# Patient Record
Sex: Female | Born: 1965 | Race: Black or African American | Hispanic: No | Marital: Single | Smoking: Never smoker
Health system: Southern US, Community
[De-identification: ages and names within clinical notes are randomized; demographics above are authoritative.]

## PROBLEM LIST (undated history)

## (undated) DIAGNOSIS — J189 Pneumonia, unspecified organism: Secondary | ICD-10-CM

## (undated) DIAGNOSIS — J45909 Unspecified asthma, uncomplicated: Secondary | ICD-10-CM

## (undated) DIAGNOSIS — D649 Anemia, unspecified: Secondary | ICD-10-CM

## (undated) DIAGNOSIS — R51 Headache: Secondary | ICD-10-CM

## (undated) DIAGNOSIS — K219 Gastro-esophageal reflux disease without esophagitis: Secondary | ICD-10-CM

## (undated) DIAGNOSIS — M329 Systemic lupus erythematosus, unspecified: Secondary | ICD-10-CM

## (undated) DIAGNOSIS — I7300 Raynaud's syndrome without gangrene: Secondary | ICD-10-CM

## (undated) DIAGNOSIS — K819 Cholecystitis, unspecified: Secondary | ICD-10-CM

## (undated) DIAGNOSIS — R519 Headache, unspecified: Secondary | ICD-10-CM

## (undated) DIAGNOSIS — IMO0002 Reserved for concepts with insufficient information to code with codable children: Secondary | ICD-10-CM

## (undated) HISTORY — PX: OTHER SURGICAL HISTORY: SHX169

## (undated) HISTORY — PX: GANGLION CYST EXCISION: SHX1691

---

## 2010-12-25 ENCOUNTER — Encounter (INDEPENDENT_AMBULATORY_CARE_PROVIDER_SITE_OTHER): Payer: Managed Care, Other (non HMO) | Admitting: Ophthalmology

## 2010-12-25 DIAGNOSIS — Z09 Encounter for follow-up examination after completed treatment for conditions other than malignant neoplasm: Secondary | ICD-10-CM

## 2010-12-25 DIAGNOSIS — H43819 Vitreous degeneration, unspecified eye: Secondary | ICD-10-CM

## 2012-05-05 ENCOUNTER — Other Ambulatory Visit (HOSPITAL_COMMUNITY)
Admission: RE | Admit: 2012-05-05 | Discharge: 2012-05-05 | Disposition: A | Discharge status: Home / Self Care | Payer: 59 | Source: Ambulatory Visit | Attending: Obstetrics and Gynecology | Admitting: Obstetrics and Gynecology

## 2012-05-05 ENCOUNTER — Other Ambulatory Visit: Payer: Self-pay | Admitting: Obstetrics and Gynecology

## 2012-05-05 DIAGNOSIS — Z113 Encounter for screening for infections with a predominantly sexual mode of transmission: Secondary | ICD-10-CM | POA: Insufficient documentation

## 2012-05-05 DIAGNOSIS — Z1151 Encounter for screening for human papillomavirus (HPV): Secondary | ICD-10-CM | POA: Insufficient documentation

## 2012-05-05 DIAGNOSIS — Z01419 Encounter for gynecological examination (general) (routine) without abnormal findings: Secondary | ICD-10-CM | POA: Insufficient documentation

## 2012-05-24 ENCOUNTER — Other Ambulatory Visit: Payer: Self-pay | Admitting: Obstetrics and Gynecology

## 2013-06-14 ENCOUNTER — Other Ambulatory Visit: Payer: Self-pay

## 2013-06-14 DIAGNOSIS — Z1231 Encounter for screening mammogram for malignant neoplasm of breast: Secondary | ICD-10-CM

## 2013-06-22 ENCOUNTER — Encounter (HOSPITAL_COMMUNITY): Payer: Self-pay | Admitting: Emergency Medicine

## 2013-06-22 ENCOUNTER — Emergency Department (HOSPITAL_COMMUNITY)
Admission: EM | Admit: 2013-06-22 | Discharge: 2013-06-22 | Disposition: A | Discharge status: Home / Self Care | Payer: 59 | Attending: Emergency Medicine | Admitting: Emergency Medicine

## 2013-06-22 ENCOUNTER — Emergency Department (HOSPITAL_COMMUNITY): Payer: 59

## 2013-06-22 DIAGNOSIS — Y929 Unspecified place or not applicable: Secondary | ICD-10-CM | POA: Insufficient documentation

## 2013-06-22 DIAGNOSIS — Z872 Personal history of diseases of the skin and subcutaneous tissue: Secondary | ICD-10-CM | POA: Insufficient documentation

## 2013-06-22 DIAGNOSIS — T148XXA Other injury of unspecified body region, initial encounter: Secondary | ICD-10-CM

## 2013-06-22 DIAGNOSIS — Z880 Allergy status to penicillin: Secondary | ICD-10-CM | POA: Insufficient documentation

## 2013-06-22 DIAGNOSIS — Y9389 Activity, other specified: Secondary | ICD-10-CM | POA: Insufficient documentation

## 2013-06-22 DIAGNOSIS — X58XXXA Exposure to other specified factors, initial encounter: Secondary | ICD-10-CM | POA: Insufficient documentation

## 2013-06-22 DIAGNOSIS — J45909 Unspecified asthma, uncomplicated: Secondary | ICD-10-CM | POA: Insufficient documentation

## 2013-06-22 DIAGNOSIS — IMO0002 Reserved for concepts with insufficient information to code with codable children: Secondary | ICD-10-CM | POA: Insufficient documentation

## 2013-06-22 HISTORY — DX: Unspecified asthma, uncomplicated: J45.909

## 2013-06-22 HISTORY — DX: Reserved for concepts with insufficient information to code with codable children: IMO0002

## 2013-06-22 HISTORY — DX: Systemic lupus erythematosus, unspecified: M32.9

## 2013-06-22 MED ORDER — HYDROCODONE-ACETAMINOPHEN 5-325 MG PO TABS: 1.0000 | ORAL_TABLET | ORAL | Status: DC | PRN

## 2013-06-22 MED ORDER — METAXALONE 800 MG PO TABS: 800.0000 mg | ORAL_TABLET | Freq: Three times a day (TID) | ORAL | Status: DC

## 2013-06-22 MED ORDER — SODIUM CHLORIDE 0.9 % IV SOLN: INTRAVENOUS | Status: DC

## 2013-06-22 NOTE — ED Provider Notes (Signed)
CSN: [id number]     Arrival date & time 06/22/13  [id number] History  This chart was scribed for Toy BakerAnthony T Jessy Cybulski, MD by Shari HeritageAisha Amuda, ED Scribe. The patient was seen in room APA05/APA05. Patient's care was started at 7:22 AM.  Chief Complaint  Patient presents with  . Abdominal Pain    The history is provided by the patient. No language interpreter was used.   HPI Comments: Charlett NoseVickie Zerbe is a 48 y.o. female who presents to the Emergency Department complaining of sharp, constant right sided abdominal pain starting this morning when she woke up for work and reached over to turn her off her alarm clock. She reports that this pain is new and that she did not have any pain yesterday. Her pain is exacerbated by movement, coughing, and sitting in certain positions. She denies vomiting, SOB, fever, and chills. Patient has a history of lupus. Symptoms have been persistent and no treatment used prior to arrival  Past Medical History  Diagnosis Date  . Lupus   . Asthma    Past Surgical History  Procedure Laterality Date  . Fibroid tumors    . Ganglion cyst excision Bilateral    No family history on file. History  Substance Use Topics  . Smoking status: Never Smoker   . Smokeless tobacco: Not on file  . Alcohol Use: No   OB History   Grav Para Term Preterm Abortions TAB SAB Ect Mult Living                 Review of Systems  Constitutional: Negative for fever and chills.  Gastrointestinal: Positive for abdominal pain (right side). Negative for nausea and vomiting.  Musculoskeletal: Negative for back pain.  All other systems reviewed and are negative.   Allergies  Aspirin; Codeine; Dust mite extract; Penicillins; and Sulfa antibiotics  Home Medications   Prior to Admission medications   Not on File   Triage Vitals: BP 148/101  Pulse 89  Temp(Src) 98.3 F (36.8 C) (Oral)  Resp 18  Ht 5' (1.524 m)  Wt 173 lb (78.472 kg)  BMI 33.79 kg/m2  SpO2 100%  LMP 06/15/2013 Physical Exam   Nursing note and vitals reviewed. Constitutional: She is oriented to person, place, and time. She appears well-developed and well-nourished.  Non-toxic appearance. No distress.  HENT:  Head: Normocephalic and atraumatic.  Eyes: Conjunctivae, EOM and lids are normal. Pupils are equal, round, and reactive to light.  Neck: Normal range of motion. Neck supple. No tracheal deviation present. No mass present.  Cardiovascular: Normal rate, regular rhythm and normal heart sounds.  Exam reveals no gallop.   No murmur heard. Pulmonary/Chest: Effort normal and breath sounds normal. No stridor. No respiratory distress. She has no decreased breath sounds. She has no wheezes. She has no rhonchi. She has no rales. She exhibits bony tenderness. She exhibits no crepitus.  Abdominal: Soft. Normal appearance and bowel sounds are normal. She exhibits no distension. There is no tenderness. There is no rebound and no CVA tenderness.    Musculoskeletal: Normal range of motion. She exhibits no edema and no tenderness.  Neurological: She is alert and oriented to person, place, and time. She has normal strength. No cranial nerve deficit or sensory deficit. GCS eye subscore is 4. GCS verbal subscore is 5. GCS motor subscore is 6.  Skin: Skin is warm and dry. No abrasion and no rash noted.  Psychiatric: She has a normal mood and affect. Her speech is normal and behavior  is normal.    ED Course  Procedures (including critical care time) DIAGNOSTIC STUDIES: Oxygen Saturation is 100% on room air, normal by my interpretation.    COORDINATION OF CARE: 7:27 AM- Will order x-ray of chest and right ribs. Patient informed of current plan for treatment and evaluation and agrees with plan at this time.    Labs Review Labs Reviewed - No data to display  Imaging Review No results found.   EKG Interpretation None      MDM   Final diagnoses:  None    I personally performed the services described in this  documentation, which was scribed in my presence. The recorded information has been reviewed and is accurate.  Patient with likely muscle strain. Rib series is negative. Patient to be placed on muscle accidents and given pain medication. Return precautions given   Toy BakerAnthony T Jireh Vinas, MD 06/22/13 [phone]

## 2013-06-22 NOTE — ED Notes (Signed)
Pt undressing, waist up. Hospital gown on

## 2013-06-22 NOTE — Discharge Instructions (Signed)
Return here for trouble breathing or worsening pain Muscle Strain A muscle strain is an injury that occurs when a muscle is stretched beyond its normal length. Usually a small number of muscle fibers are torn when this happens. Muscle strain is rated in degrees. First-degree strains have the least amount of muscle fiber tearing and pain. Second-degree and third-degree strains have increasingly more tearing and pain.  Usually, recovery from muscle strain takes 1-2 weeks. Complete healing takes 5-6 weeks.  CAUSES  Muscle strain happens when a sudden, violent force placed on a muscle stretches it too far. This may occur with lifting, sports, or a fall.  RISK FACTORS Muscle strain is especially common in athletes.  SIGNS AND SYMPTOMS At the site of the muscle strain, there may be:  Pain.  Bruising.  Swelling.  Difficulty using the muscle due to pain or lack of normal function. DIAGNOSIS  Your health care provider will perform a physical exam and ask about your medical history. TREATMENT  Often, the best treatment for a muscle strain is resting, icing, and applying cold compresses to the injured area.  HOME CARE INSTRUCTIONS   Use the PRICE method of treatment to promote muscle healing during the first 2-3 days after your injury. The PRICE method involves:  Protecting the muscle from being injured again.  Restricting your activity and resting the injured body part.  Icing your injury. To do this, put ice in a plastic bag. Place a towel between your skin and the bag. Then, apply the ice and leave it on from 15-20 minutes each hour. After the third day, switch to moist heat packs.  Apply compression to the injured area with a splint or elastic bandage. Be careful not to wrap it too tightly. This may interfere with blood circulation or increase swelling.  Elevate the injured body part above the level of your heart as often as you can.  Only take over-the-counter or prescription medicines  for pain, discomfort, or fever as directed by your health care provider.  Warming up prior to exercise helps to prevent future muscle strains. SEEK MEDICAL CARE IF:   You have increasing pain or swelling in the injured area.  You have numbness, tingling, or a significant loss of strength in the injured area. MAKE SURE YOU:   Understand these instructions.  Will watch your condition.  Will get help right away if you are not doing well or get worse. Document Released: 12/23/2004 Document Revised: 10/13/2012 Document Reviewed: 07/22/2012 [facility] Patient Information 2015 [employer], [place]. This information is not intended to replace advice given to you by your health care provider. Make sure you discuss any questions you have with your health care provider.

## 2013-06-22 NOTE — ED Notes (Signed)
Patient c/o right side pain that started when she woke up this morning.  Patient denies any nausea or vomiting.

## 2013-06-22 NOTE — ED Notes (Signed)
Present with Dr. Freida BusmanAllen in room performing assessment.

## 2013-06-23 ENCOUNTER — Ambulatory Visit
Admission: RE | Admit: 2013-06-23 | Discharge: 2013-06-23 | Disposition: A | Discharge status: Home / Self Care | Payer: 59 | Source: Ambulatory Visit

## 2013-06-23 DIAGNOSIS — Z1231 Encounter for screening mammogram for malignant neoplasm of breast: Secondary | ICD-10-CM

## 2014-05-10 ENCOUNTER — Other Ambulatory Visit: Payer: Self-pay | Admitting: Obstetrics and Gynecology

## 2014-05-10 ENCOUNTER — Other Ambulatory Visit (HOSPITAL_COMMUNITY)
Admission: RE | Admit: 2014-05-10 | Discharge: 2014-05-10 | Disposition: A | Discharge status: Home / Self Care | Payer: 59 | Source: Ambulatory Visit | Attending: Obstetrics and Gynecology | Admitting: Obstetrics and Gynecology

## 2014-05-10 DIAGNOSIS — Z01419 Encounter for gynecological examination (general) (routine) without abnormal findings: Secondary | ICD-10-CM | POA: Insufficient documentation

## 2014-05-11 LAB — CYTOLOGY - PAP

## 2014-05-31 ENCOUNTER — Other Ambulatory Visit: Payer: Self-pay | Admitting: Obstetrics and Gynecology

## 2014-07-17 ENCOUNTER — Other Ambulatory Visit: Payer: Self-pay

## 2014-07-17 DIAGNOSIS — Z1231 Encounter for screening mammogram for malignant neoplasm of breast: Secondary | ICD-10-CM

## 2014-08-17 ENCOUNTER — Ambulatory Visit: Payer: 59

## 2014-08-31 ENCOUNTER — Ambulatory Visit
Admission: RE | Admit: 2014-08-31 | Discharge: 2014-08-31 | Disposition: A | Discharge status: Home / Self Care | Payer: 59 | Source: Ambulatory Visit

## 2014-08-31 ENCOUNTER — Inpatient Hospital Stay: Admission: RE | Admit: 2014-08-31 | Payer: 59 | Source: Ambulatory Visit

## 2014-08-31 DIAGNOSIS — Z1231 Encounter for screening mammogram for malignant neoplasm of breast: Secondary | ICD-10-CM

## 2015-05-18 ENCOUNTER — Other Ambulatory Visit: Payer: Self-pay | Admitting: Obstetrics and Gynecology

## 2015-05-18 ENCOUNTER — Other Ambulatory Visit (HOSPITAL_COMMUNITY)
Admission: RE | Admit: 2015-05-18 | Discharge: 2015-05-18 | Disposition: A | Discharge status: Home / Self Care | Payer: 59 | Source: Ambulatory Visit | Attending: Obstetrics and Gynecology | Admitting: Obstetrics and Gynecology

## 2015-05-18 DIAGNOSIS — Z01419 Encounter for gynecological examination (general) (routine) without abnormal findings: Secondary | ICD-10-CM | POA: Diagnosis present

## 2015-05-18 DIAGNOSIS — Z1151 Encounter for screening for human papillomavirus (HPV): Secondary | ICD-10-CM | POA: Diagnosis not present

## 2015-05-22 LAB — CYTOLOGY - PAP

## 2015-08-31 ENCOUNTER — Other Ambulatory Visit: Payer: Self-pay | Admitting: Obstetrics and Gynecology

## 2015-08-31 ENCOUNTER — Other Ambulatory Visit: Payer: Self-pay | Admitting: Emergency Medicine

## 2015-08-31 DIAGNOSIS — Z1231 Encounter for screening mammogram for malignant neoplasm of breast: Secondary | ICD-10-CM

## 2015-09-07 ENCOUNTER — Ambulatory Visit
Admission: RE | Admit: 2015-09-07 | Discharge: 2015-09-07 | Disposition: A | Discharge status: Home / Self Care | Payer: 59 | Source: Ambulatory Visit | Attending: Physician Assistant | Admitting: Physician Assistant

## 2015-09-07 ENCOUNTER — Other Ambulatory Visit: Payer: Self-pay | Admitting: Physician Assistant

## 2015-09-07 DIAGNOSIS — R059 Cough, unspecified: Secondary | ICD-10-CM

## 2015-09-07 DIAGNOSIS — R05 Cough: Secondary | ICD-10-CM

## 2015-09-12 ENCOUNTER — Ambulatory Visit
Admission: RE | Admit: 2015-09-12 | Discharge: 2015-09-12 | Disposition: A | Discharge status: Home / Self Care | Payer: 59 | Source: Ambulatory Visit | Attending: Obstetrics and Gynecology | Admitting: Obstetrics and Gynecology

## 2015-09-12 DIAGNOSIS — Z1231 Encounter for screening mammogram for malignant neoplasm of breast: Secondary | ICD-10-CM

## 2016-05-23 ENCOUNTER — Other Ambulatory Visit: Payer: Self-pay | Admitting: Obstetrics and Gynecology

## 2016-05-23 DIAGNOSIS — Z1231 Encounter for screening mammogram for malignant neoplasm of breast: Secondary | ICD-10-CM

## 2016-08-06 DIAGNOSIS — K819 Cholecystitis, unspecified: Secondary | ICD-10-CM

## 2016-08-06 HISTORY — DX: Cholecystitis, unspecified: K81.9

## 2016-08-12 ENCOUNTER — Ambulatory Visit: Payer: Self-pay | Admitting: Surgery

## 2016-08-12 ENCOUNTER — Other Ambulatory Visit: Payer: Self-pay | Admitting: Family Medicine

## 2016-08-12 ENCOUNTER — Encounter (HOSPITAL_COMMUNITY): Payer: Self-pay

## 2016-08-12 ENCOUNTER — Ambulatory Visit
Admission: RE | Admit: 2016-08-12 | Discharge: 2016-08-12 | Disposition: A | Discharge status: Home / Self Care | Payer: 59 | Source: Ambulatory Visit | Attending: Family Medicine | Admitting: Family Medicine

## 2016-08-12 ENCOUNTER — Observation Stay (HOSPITAL_COMMUNITY)
Admission: EM | Admit: 2016-08-12 | Discharge: 2016-08-14 | DRG: 419 | Disposition: A | Discharge status: Home / Self Care | Payer: 59 | Attending: Internal Medicine | Admitting: Internal Medicine

## 2016-08-12 DIAGNOSIS — R52 Pain, unspecified: Secondary | ICD-10-CM

## 2016-08-12 DIAGNOSIS — M329 Systemic lupus erythematosus, unspecified: Secondary | ICD-10-CM | POA: Insufficient documentation

## 2016-08-12 DIAGNOSIS — Z79899 Other long term (current) drug therapy: Secondary | ICD-10-CM | POA: Diagnosis not present

## 2016-08-12 DIAGNOSIS — J4520 Mild intermittent asthma, uncomplicated: Secondary | ICD-10-CM | POA: Diagnosis not present

## 2016-08-12 DIAGNOSIS — Z833 Family history of diabetes mellitus: Secondary | ICD-10-CM | POA: Insufficient documentation

## 2016-08-12 DIAGNOSIS — Z6835 Body mass index (BMI) 35.0-35.9, adult: Secondary | ICD-10-CM | POA: Insufficient documentation

## 2016-08-12 DIAGNOSIS — K819 Cholecystitis, unspecified: Secondary | ICD-10-CM

## 2016-08-12 DIAGNOSIS — Z8249 Family history of ischemic heart disease and other diseases of the circulatory system: Secondary | ICD-10-CM | POA: Diagnosis not present

## 2016-08-12 DIAGNOSIS — K8000 Calculus of gallbladder with acute cholecystitis without obstruction: Principal | ICD-10-CM | POA: Insufficient documentation

## 2016-08-12 DIAGNOSIS — R7989 Other specified abnormal findings of blood chemistry: Secondary | ICD-10-CM | POA: Diagnosis present

## 2016-08-12 DIAGNOSIS — J45909 Unspecified asthma, uncomplicated: Secondary | ICD-10-CM | POA: Insufficient documentation

## 2016-08-12 DIAGNOSIS — K810 Acute cholecystitis: Secondary | ICD-10-CM | POA: Diagnosis not present

## 2016-08-12 DIAGNOSIS — K219 Gastro-esophageal reflux disease without esophagitis: Secondary | ICD-10-CM | POA: Insufficient documentation

## 2016-08-12 DIAGNOSIS — R1013 Epigastric pain: Secondary | ICD-10-CM | POA: Diagnosis present

## 2016-08-12 DIAGNOSIS — Z419 Encounter for procedure for purposes other than remedying health state, unspecified: Secondary | ICD-10-CM

## 2016-08-12 DIAGNOSIS — R1011 Right upper quadrant pain: Secondary | ICD-10-CM

## 2016-08-12 DIAGNOSIS — R945 Abnormal results of liver function studies: Secondary | ICD-10-CM

## 2016-08-12 HISTORY — DX: Cholecystitis, unspecified: K81.9

## 2016-08-12 LAB — LIPASE, BLOOD: LIPASE: 141 U/L — AB (ref 11–51)

## 2016-08-12 LAB — COMPREHENSIVE METABOLIC PANEL
ALT: 430 U/L — ABNORMAL HIGH (ref 14–54)
ANION GAP: 13 (ref 5–15)
AST: 376 U/L — ABNORMAL HIGH (ref 15–41)
Albumin: 4.4 g/dL (ref 3.5–5.0)
Alkaline Phosphatase: 224 U/L — ABNORMAL HIGH (ref 38–126)
BUN: 8 mg/dL (ref 6–20)
CHLORIDE: 104 mmol/L (ref 101–111)
CO2: 20 mmol/L — AB (ref 22–32)
Calcium: 9.2 mg/dL (ref 8.9–10.3)
Creatinine, Ser: 0.68 mg/dL (ref 0.44–1.00)
GFR calc non Af Amer: >60 mL/min (ref >60)
Glucose, Bld: 85 mg/dL (ref 65–99)
Potassium: 3.7 mmol/L (ref 3.5–5.1)
Sodium: 137 mmol/L (ref 135–145)
Total Bilirubin: 1.3 mg/dL — ABNORMAL HIGH (ref 0.3–1.2)
Total Protein: 8.1 g/dL (ref 6.5–8.1)

## 2016-08-12 LAB — CBC
HCT: 40.3 % (ref 36.0–46.0)
HEMOGLOBIN: 12.7 g/dL (ref 12.0–15.0)
MCH: 26.8 pg (ref 26.0–34.0)
MCHC: 31.5 g/dL (ref 30.0–36.0)
MCV: 85.2 fL (ref 78.0–100.0)
Platelets: 306 10*3/uL (ref 150–400)
RBC: 4.73 MIL/uL (ref 3.87–5.11)
RDW: 12.9 % (ref 11.5–15.5)
WBC: 6.9 10*3/uL (ref 4.0–10.5)

## 2016-08-12 LAB — I-STAT CG4 LACTIC ACID, ED: LACTIC ACID, VENOUS: 1.2 mmol/L (ref 0.5–1.9)

## 2016-08-12 MED ORDER — OXYCODONE HCL 5 MG PO TABS
5.0000 mg | ORAL_TABLET | ORAL | Status: DC | PRN
  Filled 2016-08-12: qty 1

## 2016-08-12 MED ORDER — SODIUM CHLORIDE 0.9 % IV SOLN
INTRAVENOUS | Status: DC
  Administered 2016-08-12: 23:00:00 via INTRAVENOUS

## 2016-08-12 MED ORDER — CIPROFLOXACIN IN D5W 400 MG/200ML IV SOLN
400.0000 mg | Freq: Two times a day (BID) | INTRAVENOUS | Status: DC
  Administered 2016-08-12 – 2016-08-14 (×5): 400 mg via INTRAVENOUS
  Filled 2016-08-12 (×4): qty 200

## 2016-08-12 MED ORDER — ACETAMINOPHEN 650 MG RE SUPP: 650.0000 mg | Freq: Four times a day (QID) | RECTAL | Status: DC | PRN

## 2016-08-12 MED ORDER — HYDROMORPHONE HCL 1 MG/ML IJ SOLN
0.5000 mg | Freq: Once | INTRAMUSCULAR | Status: AC
  Administered 2016-08-12: 0.5 mg via INTRAVENOUS
  Filled 2016-08-12: qty 1

## 2016-08-12 MED ORDER — SODIUM CHLORIDE 0.9 % IV BOLUS (SEPSIS)
1000.0000 mL | Freq: Once | INTRAVENOUS | Status: AC
  Administered 2016-08-12: 1000 mL via INTRAVENOUS

## 2016-08-12 MED ORDER — DOCUSATE SODIUM 100 MG PO CAPS
100.0000 mg | ORAL_CAPSULE | Freq: Two times a day (BID) | ORAL | Status: DC
  Administered 2016-08-13 – 2016-08-14 (×3): 100 mg via ORAL
  Filled 2016-08-12 (×2): qty 1

## 2016-08-12 MED ORDER — ALBUTEROL SULFATE (2.5 MG/3ML) 0.083% IN NEBU: 2.5000 mg | INHALATION_SOLUTION | RESPIRATORY_TRACT | Status: DC | PRN

## 2016-08-12 MED ORDER — SODIUM CHLORIDE 0.9 % IV SOLN
INTRAVENOUS | Status: AC
  Administered 2016-08-13: 04:00:00 via INTRAVENOUS

## 2016-08-12 MED ORDER — IBUPROFEN 600 MG PO TABS
600.0000 mg | ORAL_TABLET | Freq: Four times a day (QID) | ORAL | Status: DC | PRN
  Administered 2016-08-14: 600 mg via ORAL
  Filled 2016-08-12: qty 1

## 2016-08-12 MED ORDER — ONDANSETRON HCL 4 MG/2ML IJ SOLN
4.0000 mg | Freq: Four times a day (QID) | INTRAMUSCULAR | Status: DC | PRN
  Administered 2016-08-13 (×2): 4 mg via INTRAVENOUS
  Filled 2016-08-12 (×2): qty 2

## 2016-08-12 MED ORDER — HYDROMORPHONE HCL 1 MG/ML IJ SOLN
0.5000 mg | INTRAMUSCULAR | Status: DC | PRN
  Administered 2016-08-12: 0.5 mg via INTRAVENOUS
  Filled 2016-08-12: qty 1

## 2016-08-12 MED ORDER — IBUPROFEN 400 MG PO TABS: 600.0000 mg | ORAL_TABLET | Freq: Four times a day (QID) | ORAL | Status: DC | PRN

## 2016-08-12 MED ORDER — ACETAMINOPHEN 325 MG PO TABS
650.0000 mg | ORAL_TABLET | Freq: Four times a day (QID) | ORAL | Status: DC | PRN
  Administered 2016-08-13: 650 mg via ORAL
  Filled 2016-08-12: qty 2

## 2016-08-12 MED ORDER — ONDANSETRON HCL 4 MG PO TABS: 4.0000 mg | ORAL_TABLET | Freq: Four times a day (QID) | ORAL | Status: DC | PRN

## 2016-08-12 MED ORDER — METRONIDAZOLE IN NACL 5-0.79 MG/ML-% IV SOLN
500.0000 mg | Freq: Three times a day (TID) | INTRAVENOUS | Status: DC
  Administered 2016-08-12 – 2016-08-13 (×3): 500 mg via INTRAVENOUS
  Filled 2016-08-12 (×4): qty 100

## 2016-08-12 NOTE — Consult Note (Signed)
Surgical Consultation Requesting provider: Dr. Corlis LeakMackuen   CC: acute cholecystitis  HPI: This is a very nice 51yo woman who has been having right upper quadrant pain for about 2 weeks. It is sharp and aching in quality and radiates from the right subcostal margin to the epigastrium and right side/back. It has gradually been worsening and has always been exacerbated by eating fried foods. It became unbearable last night to the point that she could not sleep. She has not had any fever or nausea, but did spit up some gingerale at one point. She has not noticed any jaundice or change in her bowel function.  She was evaluated by her PCP today who diagnosed her with cholecystitis and directed her to the ER for further care.  She has had uterine fibroids removed via pfannenstiel but no other abdominal surgery She does not use tobacco, drugs or alcohol She works in Therapist, nutritionaloffice management at [employer]  She has asthma but does not use her daily advair and rarely needs any treatment for this. Her lupus is controlled with plaquenil, she is not on any steroids.   Allergies  Allergen Reactions  . Aspirin   . Betadine [Povidone Iodine]   . Codeine   . Dust Mite Extract   . Penicillins   . Sulfa Antibiotics     Past Medical History:  Diagnosis Date  . Asthma   . Lupus     Past Surgical History:  Procedure Laterality Date  . fibroid tumors    . GANGLION CYST EXCISION Bilateral     History reviewed. No pertinent family history.  Social History   Social History  . Marital status: Single    Spouse name: N/A  . Number of children: N/A  . Years of education: N/A   Social History Main Topics  . Smoking status: Never Smoker  . Smokeless tobacco: Never Used  . Alcohol use No  . Drug use: No  . Sexual activity: Not Asked   Other Topics Concern  . None   Social History Narrative  . None    No current facility-administered medications on file prior to encounter.    Current  Outpatient Prescriptions on File Prior to Encounter  Medication Sig Dispense Refill  . HYDROcodone-acetaminophen (NORCO/VICODIN) 5-325 MG per tablet Take 1-2 tablets by mouth every 4 (four) hours as needed for moderate pain or severe pain. 10 tablet 0  . metaxalone (SKELAXIN) 800 MG tablet Take 1 tablet (800 mg total) by mouth 3 (three) times daily. 21 tablet 0    Review of Systems: a complete, 10pt review of systems was completed with pertinent positives and negatives as documented in the HPI.   Physical Exam: Vitals:   08/12/16 1548  BP: (!) 133/96  Pulse: 94  Resp: 18  Temp: 98.3 F (36.8 C)   Gen: A&Ox3, no distress Head: normocephalic, atraumatic, extraocular motion intact, anicteric.  Neck: supple without mass or thyromegaly Chest: unlabored respirations, symmetrical air entry, clear bilaterally  Cardiovascular: RRR with palpable distal pulses Abdomen: soft, obese, tender in right subcostal margin and epigastrium, no guarding or rebound Extremities: warm, without edema, no deformities Neuro: grossly intact, strength 5/5 all 4 extremities, sensation intact to light touch x 4 extremities Psych: appropriate mood and affect, normal insight Skin: warm and dry  CBC Latest Ref Rng & Units 08/12/2016  WBC 4.0 - 10.5 K/uL 6.9  Hemoglobin 12.0 - 15.0 g/dL 29.512.7  Hematocrit 62.136.0 - 46.0 % 40.3  Platelets 150 - 400 K/uL  306    CMP Latest Ref Rng & Units 08/12/2016  Glucose 65 - 99 mg/dL 85  BUN 6 - 20 mg/dL 8  Creatinine 5.40 - 9.81 mg/dL 1.91  Sodium 478 - 295 mmol/L 137  Potassium 3.5 - 5.1 mmol/L 3.7  Chloride 101 - 111 mmol/L 104  CO2 22 - 32 mmol/L 20(L)  Calcium 8.9 - 10.3 mg/dL 9.2  Total Protein 6.5 - 8.1 g/dL 8.1  Total Bilirubin 0.3 - 1.2 mg/dL 6.2(Z)  Alkaline Phos 38 - 126 U/L 224(H)  AST 15 - 41 U/L 376(H)  ALT 14 - 54 U/L 430(H)  Lipase 141  No results found for: INR, PROTIME  Imaging: CLINICAL DATA:  Upper abdominal pain  EXAM: ABDOMEN ULTRASOUND  COMPLETE  COMPARISON:  None.  FINDINGS: Gallbladder: Within the gallbladder, there are multiple echogenic foci which move and shadow consistent with cholelithiasis. Largest individual gallstone measures 5 mm in length. There is diffuse gallbladder wall thickening with edema and pericholecystic fluid. Patient is focally tender over the gallbladder.  Common bile duct: Diameter: 5 mm. There is no intrahepatic, common hepatic, or common bile duct dilatation.  Liver: No focal lesion identified. Within normal limits in parenchymal echogenicity. Flow in the portal vein is in the anatomic direction.  IVC: No abnormality visualized in portions which can be interrogated. Most of the inferior vena cava is obscured by gas.  Pancreas: Visualized portion unremarkable. Much of the pancreas is obscured by gas.  Spleen: Size and appearance within normal limits.  Right Kidney: Length: 11.0 cm. Echogenicity within normal limits. No mass or hydronephrosis visualized.  Left Kidney: Length: 11.4 cm. Echogenicity within normal limits. No mass or hydronephrosis visualized.  Abdominal aorta: No aneurysm visualized. Portions of aorta obscured by gas.  Other findings: No demonstrable ascites.  IMPRESSION: 1. Gallstones with edematous, thickened gallbladder wall and pericholecystic fluid. Focal tenderness over the gallbladder. These findings are indicative of acute cholecystitis.  2. Portions of the pancreas, aorta, inferior vena cava are obscured by gas. Visualized portions of these structures appear unremarkable.  3.  Study otherwise unremarkable.   Electronically Signed   By: Bretta Bang III M.D.   On: 08/12/2016 14:53  A/P: 51yo woman with acute cholecystitis, elevated LFTs and mildly elevated lipase likely secondary to the same.  -Admit for fluid resuscitation, IV antibiotics, pain control -Repeat labs in AM, if downtrending anticipate laparoscopic cholecystectomy  and possible cholangiogram tomorrow with Dr. Magnus Ivan. I discussed the nature of the surgery with her, and we discussed the risks of bleeding, infection, scarring, pain, injury to intraabdominal structures specifically the common bile duct and sequelae, conversion to open surgery, blood clots, heart attack, pneumonia, sepsis and prolonged hospitalization/recovery. She and her sisters asked several insightful questions all of which were answered. -if labs uptrending she may need MRCP/ERCP and I discussed this briefly with her as well.    Phylliss Blakes, MD [facility], [place] Pager [phone]

## 2016-08-12 NOTE — H&P (Signed)
Sheri NoseVickie Monette ZHY:[id number] DOB: 10/27/1965 DOA: 08/12/2016     PCP: Assunta CurtisNoel [facility]man [facility]: Rheumatology Orlin HildingAngela Hawks Patient coming from:   home Lives alone,        Chief Complaint: Abdominal pain  HPI: Sheri Reed is a 51 y.o. female with medical history significant of asthma, SLE     Presented with intermittent abdominal pain: Few months now worsened and right upper quadrant and epigastric area usually after eating. Especially if she eats anything fatty. She had severe episode yesterday and she couldn't sleep all night presented to her primary care provider who did an ultrasound showed gallstones and thickened gallbladder is felt that she most excess cholecystitis and sent her to emergency department she hadn't had any fevers or chills currently pain and better control. No nausea vomiting fevers or chills Denies alcohol abuse   Regarding pertinent Chronic problems: Straight of asthma mild history of lupus not on steroids IN ER:  Temp (24hrs), Avg:98.3 F (36.8 C), Min:98.3 F (36.8 C), Max:98.3 F (36.8 C)      on arrival  ED Triage Vitals  Enc Vitals Group     BP 08/12/16 1548 (!) 133/96     Pulse Rate 08/12/16 1548 94     Resp 08/12/16 1548 18     Temp 08/12/16 1548 98.3 F (36.8 C)     Temp Source 08/12/16 1548 Oral     SpO2 08/12/16 1548 100 %     Weight 08/12/16 1549 180 lb (81.6 kg)     Height 08/12/16 1549 5' (1.524 m)     Head Circumference --      Peak Flow --      Pain Score 08/12/16 1548 10     Pain Loc --      Pain Edu? --      Excl. in GC? --   Lactic acid 1.2 Lipase 141 Sodium 1:30 7K3.7 albumin 4.4 AST 376 ALT 4:30 alkaline phosphatase 224 total bili 1.3 WBC 6.9 hemoglobin 12.7 platelets 306   ultrasound from outpatient facility worrisome for cholecystitis  Following Medications were ordered in ER: Medications  sodium chloride 0.9 % bolus 1,000 mL (not administered)  HYDROmorphone (DILAUDID) injection 0.5 mg (not administered)      ER provider discussed case with:  General surgery Dr. Edwina BarthonnerCondition admission to medicine for IV fluids and antibiotics and pain control repeat labs and if improving likely undergo laparoscopic cholecystectomy and cholangiogram if labs and not improving or getting worse A need MRCP ERCP and GI consult   Hospitalist was called for admission for Cholecystitis  Review of Systems:    Pertinent positives include:  abdominal pain, nausea,   Constitutional:  No weight loss, night sweats, Fevers, chills, fatigue, weight loss  HEENT:  No headaches, Difficulty swallowing,Tooth/dental problems,Sore throat,  No sneezing, itching, ear ache, nasal congestion, post nasal drip,  Cardio-vascular:  No chest pain, Orthopnea, PND, anasarca, dizziness, palpitations.no Bilateral lower extremity swelling  GI:  No heartburn, indigestion, diarrhea, change in bowel habits, loss of appetite, melena, blood in stool, hematemesis Resp:  no shortness of breath at rest. No dyspnea on exertion, No excess mucus, no productive cough, No non-productive cough, No coughing up of blood.No change in color of mucus.No wheezing. Skin:  no rash or lesions. No jaundice GU:  no dysuria, change in color of urine, no urgency or frequency. No straining to urinate.  No flank pain.  Musculoskeletal:  No joint pain or no joint swelling. No decreased range of  motion. No back pain.  Psych:  No change in mood or affect. No depression or anxiety. No memory loss.  Neuro: no localizing neurological complaints, no tingling, no weakness, no double vision, no gait abnormality, no slurred speech, no confusion  As per HPI otherwise 10 point review of systems negative.   Past Medical History: Past Medical History:  Diagnosis Date  . Asthma   . Lupus    Past Surgical History:  Procedure Laterality Date  . fibroid tumors    . GANGLION CYST EXCISION Bilateral      Social History:  Ambulatory   independently      reports  that she has never smoked. She has never used smokeless tobacco. She reports that she does not drink alcohol or use drugs.  Allergies:   Allergies  Allergen Reactions  . Aspirin   . Betadine [Povidone Iodine]   . Codeine   . Dust Mite Extract   . Penicillins   . Sulfa Antibiotics        Family History:   Family History  Problem Relation Age of Onset  . Hypertension Mother   . Breast cancer Mother   . Diabetes Father   . Hypertension Father   . Diabetes Sister   . Hypertension Sister   . CAD Other     Medications: Prior to Admission medications   Medication Sig Start Date End Date Taking? Authorizing Provider  HYDROcodone-acetaminophen (NORCO/VICODIN) 5-325 MG per tablet Take 1-2 tablets by mouth every 4 (four) hours as needed for moderate pain or severe pain. 06/22/13   Lorre Nick, MD  metaxalone (SKELAXIN) 800 MG tablet Take 1 tablet (800 mg total) by mouth 3 (three) times daily. 06/22/13   Lorre Nick, MD    Physical Exam: Patient Vitals for the past 24 hrs:  BP Temp Temp src Pulse Resp SpO2 Height Weight  08/12/16 1549 - - - - - - 5' (1.524 m) 81.6 kg (180 lb)  08/12/16 1548 (!) 133/96 98.3 F (36.8 C) Oral 94 18 100 % - -    1. General:  in No Acute distress 2. Psychological: Alert and  Oriented 3. Head/ENT:   Dry Mucous Membranes                          Head Non traumatic, neck supple                          Normal  Dentition 4. SKIN: decreased Skin turgor,  Skin clean Dry and intact no rash 5. Heart: Regular rate and rhythm no  Murmur, Rub or gallop 6. Lungs:  Clear to auscultation bilaterally, no wheezes or crackles   7. Abdomen: Soft,  Mildly tender, Non distended 8. Lower extremities: no clubbing, cyanosis, or edema 9. Neurologically Grossly intact, moving all 4 extremities equally  10. MSK: Normal range of motion   body mass index is 35.15 kg/m.  Labs on Admission:   Labs on Admission: I have personally reviewed following labs and  imaging studies  CBC:  Recent Labs Lab 08/12/16 1603  WBC 6.9  HGB 12.7  HCT 40.3  MCV 85.2  PLT 306   Basic Metabolic Panel:  Recent Labs Lab 08/12/16 1603  NA 137  K 3.7  CL 104  CO2 20*  GLUCOSE 85  BUN 8  CREATININE 0.68  CALCIUM 9.2   GFR: Estimated Creatinine Clearance: 76.9 mL/min (by C-G formula based on  SCr of 0.68 mg/dL). Liver Function Tests:  Recent Labs Lab 08/12/16 1603  AST 376*  ALT 430*  ALKPHOS 224*  BILITOT 1.3*  PROT 8.1  ALBUMIN 4.4    Recent Labs Lab 08/12/16 1603  LIPASE 141*   No results for input(s): AMMONIA in the last 168 hours. Coagulation Profile: No results for input(s): INR, PROTIME in the last 168 hours. Cardiac Enzymes: No results for input(s): CKTOTAL, CKMB, CKMBINDEX, TROPONINI in the last 168 hours. BNP (last 3 results) No results for input(s): PROBNP in the last 8760 hours. HbA1C: No results for input(s): HGBA1C in the last 72 hours. CBG: No results for input(s): GLUCAP in the last 168 hours. Lipid Profile: No results for input(s): CHOL, HDL, LDLCALC, TRIG, CHOLHDL, LDLDIRECT in the last 72 hours. Thyroid Function Tests: No results for input(s): TSH, T4TOTAL, FREET4, T3FREE, THYROIDAB in the last 72 hours. Anemia Panel: No results for input(s): VITAMINB12, FOLATE, FERRITIN, TIBC, IRON, RETICCTPCT in the last 72 hours. Urine analysis: No results found for: COLORURINE, APPEARANCEUR, LABSPEC, PHURINE, GLUCOSEU, HGBUR, BILIRUBINUR, KETONESUR, PROTEINUR, UROBILINOGEN, NITRITE, LEUKOCYTESUR Sepsis Labs: @LABRCNTIP (procalcitonin:4,lacticidven:4) )No results found for this or any previous visit (from the past 240 hour(s)).    UA   not ordered  No results found for: HGBA1C  Estimated Creatinine Clearance: 76.9 mL/min (by C-G formula based on SCr of 0.68 mg/dL).  BNP (last 3 results) No results for input(s): PROBNP in the last 8760 hours.   ECG REPORTNot ordered   Filed Weights   08/12/16 1549  Weight: 81.6  kg (180 lb)     Cultures: No results found for: SDES, SPECREQUEST, CULT, REPTSTATUS   Radiological Exams on Admission: US Abdomen Complete  Result Date: 08/12/2016 CLINICAL DATA:  Upper abdominal pain EXAM: ABDOMEN ULTRASOUND COMPLETE COMPARISON:  None. FINDINGS: Gallbladder: Within the gallbladder, there are multiple echogenic foci which move and shadow consistent with cholelithiasis. Largest individual gallstone measures 5 mm in length. There is diffuse gallbladder wall thickening with edema and pericholecystic fluid. Patient is focally tender over the gallbladder. Common bile duct: Diameter: 5 mm. There is no intrahepatic, common hepatic, or common bile duct dilatation. Liver: No focal lesion identified. Within normal limits in parenchymal echogenicity. Flow in the portal vein is in the anatomic direction. IVC: No abnormality visualized in portions which can be interrogated. Most of the inferior vena cava is obscured by gas. Pancreas: Visualized portion unremarkable. Much of the pancreas is obscured by gas. Spleen: Size and appearance within normal limits. Right Kidney: Length: 11.0 cm. Echogenicity within normal limits. No mass or hydronephrosis visualized. Left Kidney: Length: 11.4 cm. Echogenicity within normal limits. No mass or hydronephrosis visualized. Abdominal aorta: No aneurysm visualized. Portions of aorta obscured by gas. Other findings: No demonstrable ascites. IMPRESSION: 1. Gallstones with edematous, thickened gallbladder wall and pericholecystic fluid. Focal tenderness over the gallbladder. These findings are indicative of acute cholecystitis. 2. Portions of the pancreas, aorta, inferior vena cava are obscured by gas. Visualized portions of these structures appear unremarkable. 3.  Study otherwise unremarkable. Electronically Signed   By: Bretta Bang III M.D.   On: 08/12/2016 14:53    Chart has been reviewed    Assessment/Plan   51 y.o. female with medical history  significant of asthma, SLE   Admitted for acute cholecystitis   Present on Admission: . Acute cholecystitis -admit for IV fluids, IV antibiotics Cipro and Flagyl, pain control. Repeat cementing the morning if downtrending labs patient will undergo cholecystectomy if LFTs going up she will  need GI consult for possible ERCP . Elevated LFTs most likely secondary to cholecystitis continue to follow   asthma -  chronic currently stable albuterol when necessary  Lupus -  currently stable  hold plaquenil while NPO  Other plan as per orders.  DVT prophylaxis:  SCD     Code Status:  FULL CODE as per patient    Family Communication:   Family  at  Bedside  plan of care was discussed with   Sisters,   Disposition Plan:    To home once workup is complete and patient is stable              Consults called: general Surgery    Admission status:   inpatient       Level of care           medical floor           I have spent a total of 56 min on this admission   Kentarius Partington 08/12/2016, 9:00 PM    [facility]  Pager [phone]   after 2 AM please page floor coverage PA If 7AM-7PM, please contact the day team taking care of the patient  [email or web address]  Password [email or web address]

## 2016-08-12 NOTE — ED Triage Notes (Signed)
Pt states she was sent here by her PCP for acute cholecystitis. Pt reports abd pain for "a while" but states it got much worse last night. Pt denies n/v.

## 2016-08-12 NOTE — ED Provider Notes (Signed)
[facility]-EMERGENCY DEPT Provider Note   CSN: [id number] Arrival date & time: 08/12/16  1531     History   Chief Complaint Chief Complaint  Patient presents with  . Abdominal Pain   HPI  Blood pressure (!) 133/96, pulse 94, temperature 98.3 F (36.8 C), temperature source Oral, resp. rate 18, height 5' (1.524 m), weight 81.6 kg (180 lb), last menstrual period 08/06/2016, SpO2 100 %.  Sheri Reed is a 51 y.o. female asked medical history significant for asthma, SLE sent from primary care for cholecystitis. She's been having intermittent right upper quadrant and epigastric abdominal pain over the course of the last several months, exacerbated by eating especially fatty foods. The pain became especially severe yesterday and she was unable to sleep. She was evaluated by Deboraha Sprang primary care this morning and had an outpatient ultrasound which showed gallstones with a thickened gallbladder wall and pericholecystic fluid. She denies fever, chills. Last oral intake was last evening.  Past Medical History:  Diagnosis Date  . Asthma   . Lupus     Patient Active Problem List   Diagnosis Date Noted  . Acute cholecystitis 08/12/2016  . Elevated LFTs 08/12/2016    Past Surgical History:  Procedure Laterality Date  . fibroid tumors    . GANGLION CYST EXCISION Bilateral     OB History    No data available       Home Medications    Prior to Admission medications   Medication Sig Start Date End Date Taking? Authorizing Provider  HYDROcodone-acetaminophen (NORCO/VICODIN) 5-325 MG per tablet Take 1-2 tablets by mouth every 4 (four) hours as needed for moderate pain or severe pain. 06/22/13   Lorre Nick, MD  metaxalone (SKELAXIN) 800 MG tablet Take 1 tablet (800 mg total) by mouth 3 (three) times daily. 06/22/13   Lorre Nick, MD    Family History History reviewed. No pertinent family history.  Social History Social History  Substance Use Topics  . Smoking status: Never Smoker  .  Smokeless tobacco: Never Used  . Alcohol use No     Allergies   Aspirin; Betadine [povidone iodine]; Codeine; Dust mite extract; Penicillins; and Sulfa antibiotics   Review of Systems Review of Systems  A complete review of systems was obtained and all systems are negative except as noted in the HPI and PMH.   Physical Exam Updated Vital Signs BP (!) 133/96 (BP Location: Right Arm)   Pulse 94   Temp 98.3 F (36.8 C) (Oral)   Resp 18   Ht 5' (1.524 m)   Wt 81.6 kg (180 lb)   LMP 08/06/2016 (Within Days)   SpO2 100%   BMI 35.15 kg/m   Physical Exam  Constitutional: She is oriented to person, place, and time. She appears well-developed and well-nourished. No distress.  Nontoxic appearing  HENT:  Head: Normocephalic and atraumatic.  Mouth/Throat: Oropharynx is clear and moist.  Eyes: Pupils are equal, round, and reactive to light. Conjunctivae and EOM are normal.  Neck: Normal range of motion.  Cardiovascular: Normal rate, regular rhythm and intact distal pulses.   Pulmonary/Chest: Effort normal and breath sounds normal.  Abdominal: Soft. She exhibits no distension and no mass. There is tenderness. There is no rebound and no guarding. No hernia.  Mild tenderness to palpation in the right upper quadrant with no guarding or rebound.  Musculoskeletal: Normal range of motion.  Neurological: She is alert and oriented to person, place, and time.  Skin: She is not diaphoretic.  Psychiatric:  She has a normal mood and affect.  Nursing note and vitals reviewed.     ED Treatments / Results  Labs (all labs ordered are listed, but only abnormal results are displayed) Labs Reviewed  LIPASE, BLOOD - Abnormal; Notable for the following:       Result Value   Lipase 141 (*)    All other components within normal limits  COMPREHENSIVE METABOLIC PANEL - Abnormal; Notable for the following:    CO2 20 (*)    AST 376 (*)    ALT 430 (*)    Alkaline Phosphatase 224 (*)    Total  Bilirubin 1.3 (*)    All other components within normal limits  CBC  URINALYSIS, ROUTINE W REFLEX MICROSCOPIC  I-STAT CG4 LACTIC ACID, ED    EKG  EKG Interpretation None       Radiology [place] Abdomen Complete  Result Date: 08/12/2016 CLINICAL DATA:  Upper abdominal pain EXAM: ABDOMEN ULTRASOUND COMPLETE COMPARISON:  None. FINDINGS: Gallbladder: Within the gallbladder, there are multiple echogenic foci which move and shadow consistent with cholelithiasis. Largest individual gallstone measures 5 mm in length. There is diffuse gallbladder wall thickening with edema and pericholecystic fluid. Patient is focally tender over the gallbladder. Common bile duct: Diameter: 5 mm. There is no intrahepatic, common hepatic, or common bile duct dilatation. Liver: No focal lesion identified. Within normal limits in parenchymal echogenicity. Flow in the portal vein is in the anatomic direction. IVC: No abnormality visualized in portions which can be interrogated. Most of the inferior vena cava is obscured by gas. Pancreas: Visualized portion unremarkable. Much of the pancreas is obscured by gas. Spleen: Size and appearance within normal limits. Right Kidney: Length: 11.0 cm. Echogenicity within normal limits. No mass or hydronephrosis visualized. Left Kidney: Length: 11.4 cm. Echogenicity within normal limits. No mass or hydronephrosis visualized. Abdominal aorta: No aneurysm visualized. Portions of aorta obscured by gas. Other findings: No demonstrable ascites. IMPRESSION: 1. Gallstones with edematous, thickened gallbladder wall and pericholecystic fluid. Focal tenderness over the gallbladder. These findings are indicative of acute cholecystitis. 2. Portions of the pancreas, aorta, inferior vena cava are obscured by gas. Visualized portions of these structures appear unremarkable. 3.  Study otherwise unremarkable. Electronically Signed   By: Bretta BangWilliam  Woodruff III M.D.   On: 08/12/2016 14:53    Procedures Procedures  (including critical care time)  Medications Ordered in ED Medications  sodium chloride 0.9 % bolus 1,000 mL (not administered)  HYDROmorphone (DILAUDID) injection 0.5 mg (not administered)     Initial Impression / Assessment and Plan / ED Course  I have reviewed the triage vital signs and the nursing notes.  Pertinent labs & imaging results that were available during my care of the patient were reviewed by me and considered in my medical decision making (see chart for details).     Vitals:   08/12/16 1548 08/12/16 1549  BP: (!) 133/96   Pulse: 94   Resp: 18   Temp: 98.3 F (36.8 C)   TempSrc: Oral   SpO2: 100%   Weight:  81.6 kg (180 lb)  Height:  5' (1.524 m)    Medications  sodium chloride 0.9 % bolus 1,000 mL (not administered)  HYDROmorphone (DILAUDID) injection 0.5 mg (not administered)    Charlett NoseVickie Sanmiguel is 51 y.o. female presenting with Acute cholecystitis, afebrile and nontoxic appearing, mild tenderness to palpation over the right upper quadrant, she has a transaminitis with elevated lipase and a T bili of 1.3, outpatient ultrasound with  acute cholecystitis, advised patient to remain nothing by mouth, fluid bolus and morphine administered.  Discussed with general surgeon Dr. Doylene Canard states that she will not be taking her to the operating room tonight, request a medical admission given her comorbidities of asthma and lupus.  Plan is for cholecystectomy in the a.m. if LFTs improve overnight. Discussed with [facility] who accepts admission.     Final Clinical Impressions(s) / ED Diagnoses   Final diagnoses:  Pain  Cholecystitis    New Prescriptions New Prescriptions   No medications on file     Kaylyn Lim 08/12/16 1910    Abelino Derrick, MD 08/12/16 2342

## 2016-08-13 ENCOUNTER — Encounter (HOSPITAL_COMMUNITY): Payer: Self-pay | Admitting: Orthopedic Surgery

## 2016-08-13 ENCOUNTER — Inpatient Hospital Stay (HOSPITAL_COMMUNITY): Payer: 59 | Admitting: Certified Registered Nurse Anesthetist

## 2016-08-13 ENCOUNTER — Encounter (HOSPITAL_COMMUNITY)
Admission: EM | Disposition: A | Discharge status: Home / Self Care | Payer: Self-pay | Source: Home / Self Care | Attending: Physician Assistant

## 2016-08-13 ENCOUNTER — Inpatient Hospital Stay (HOSPITAL_COMMUNITY): Payer: 59

## 2016-08-13 DIAGNOSIS — R7989 Other specified abnormal findings of blood chemistry: Secondary | ICD-10-CM | POA: Diagnosis not present

## 2016-08-13 DIAGNOSIS — K810 Acute cholecystitis: Secondary | ICD-10-CM

## 2016-08-13 DIAGNOSIS — M329 Systemic lupus erythematosus, unspecified: Secondary | ICD-10-CM

## 2016-08-13 DIAGNOSIS — J4520 Mild intermittent asthma, uncomplicated: Secondary | ICD-10-CM | POA: Diagnosis not present

## 2016-08-13 DIAGNOSIS — J45909 Unspecified asthma, uncomplicated: Secondary | ICD-10-CM | POA: Diagnosis not present

## 2016-08-13 DIAGNOSIS — K219 Gastro-esophageal reflux disease without esophagitis: Secondary | ICD-10-CM | POA: Diagnosis not present

## 2016-08-13 HISTORY — PX: LAPAROSCOPIC CHOLECYSTECTOMY: SUR755

## 2016-08-13 HISTORY — PX: CHOLECYSTECTOMY: SHX55

## 2016-08-13 LAB — CBC
HCT: 35.6 % — ABNORMAL LOW (ref 36.0–46.0)
Hemoglobin: 11 g/dL — ABNORMAL LOW (ref 12.0–15.0)
MCH: 26.7 pg (ref 26.0–34.0)
MCHC: 30.9 g/dL (ref 30.0–36.0)
MCV: 86.4 fL (ref 78.0–100.0)
Platelets: 301 10*3/uL (ref 150–400)
RBC: 4.12 MIL/uL (ref 3.87–5.11)
RDW: 13.2 % (ref 11.5–15.5)
WBC: 5.2 10*3/uL (ref 4.0–10.5)

## 2016-08-13 LAB — URINALYSIS, ROUTINE W REFLEX MICROSCOPIC
Bacteria, UA: NONE SEEN
Bilirubin Urine: NEGATIVE
Glucose, UA: NEGATIVE mg/dL
KETONES UR: 5 mg/dL — AB
NITRITE: NEGATIVE
PROTEIN: NEGATIVE mg/dL
Specific Gravity, Urine: 1.014 (ref 1.005–1.030)
pH: 6 (ref 5.0–8.0)

## 2016-08-13 LAB — COMPREHENSIVE METABOLIC PANEL
ALBUMIN: 3.6 g/dL (ref 3.5–5.0)
ALK PHOS: 171 U/L — AB (ref 38–126)
ALT: 283 U/L — AB (ref 14–54)
ANION GAP: 8 (ref 5–15)
AST: 146 U/L — ABNORMAL HIGH (ref 15–41)
BILIRUBIN TOTAL: 0.9 mg/dL (ref 0.3–1.2)
BUN: 7 mg/dL (ref 6–20)
CALCIUM: 8.4 mg/dL — AB (ref 8.9–10.3)
CO2: 22 mmol/L (ref 22–32)
CREATININE: 0.59 mg/dL (ref 0.44–1.00)
Chloride: 109 mmol/L (ref 101–111)
GFR calc Af Amer: >60 mL/min (ref >60)
GFR calc non Af Amer: >60 mL/min (ref >60)
Glucose, Bld: 94 mg/dL (ref 65–99)
Potassium: 4.2 mmol/L (ref 3.5–5.1)
SODIUM: 139 mmol/L (ref 135–145)
TOTAL PROTEIN: 6.8 g/dL (ref 6.5–8.1)

## 2016-08-13 LAB — I-STAT BETA HCG BLOOD, ED (NOT ORDERABLE): I-stat hCG, quantitative: <5 m[IU]/mL (ref <5)

## 2016-08-13 LAB — MAGNESIUM: Magnesium: 2 mg/dL (ref 1.7–2.4)

## 2016-08-13 LAB — SURGICAL PCR SCREEN
MRSA, PCR: NEGATIVE
STAPHYLOCOCCUS AUREUS: POSITIVE — AB

## 2016-08-13 LAB — TSH: TSH: 0.99 u[IU]/mL (ref 0.350–4.500)

## 2016-08-13 LAB — PHOSPHORUS: PHOSPHORUS: 3.9 mg/dL (ref 2.5–4.6)

## 2016-08-13 LAB — HIV ANTIBODY (ROUTINE TESTING W REFLEX): HIV Screen 4th Generation wRfx: NONREACTIVE

## 2016-08-13 LAB — I-STAT CG4 LACTIC ACID, ED: Lactic Acid, Venous: 0.75 mmol/L (ref 0.5–1.9)

## 2016-08-13 LAB — LIPASE, BLOOD: Lipase: 30 U/L (ref 11–51)

## 2016-08-13 SURGERY — LAPAROSCOPIC CHOLECYSTECTOMY WITH INTRAOPERATIVE CHOLANGIOGRAM
Anesthesia: General | Site: Abdomen

## 2016-08-13 MED ORDER — CHLORHEXIDINE GLUCONATE CLOTH 2 % EX PADS
6.0000 | MEDICATED_PAD | Freq: Every day | CUTANEOUS | Status: DC
  Administered 2016-08-14: 6 via TOPICAL

## 2016-08-13 MED ORDER — DEXAMETHASONE SODIUM PHOSPHATE 10 MG/ML IJ SOLN
INTRAMUSCULAR | Status: AC
  Filled 2016-08-13: qty 1

## 2016-08-13 MED ORDER — MIDAZOLAM HCL 2 MG/2ML IJ SOLN
INTRAMUSCULAR | Status: AC
  Filled 2016-08-13: qty 2

## 2016-08-13 MED ORDER — SODIUM CHLORIDE 0.9 % IR SOLN
Status: DC | PRN
  Administered 2016-08-13: 1000 mL

## 2016-08-13 MED ORDER — MUPIROCIN 2 % EX OINT
1.0000 "application " | TOPICAL_OINTMENT | Freq: Two times a day (BID) | CUTANEOUS | Status: DC
  Administered 2016-08-13 – 2016-08-14 (×2): 1 via NASAL
  Filled 2016-08-13: qty 22

## 2016-08-13 MED ORDER — HYDROMORPHONE HCL 1 MG/ML IJ SOLN
1.0000 mg | INTRAMUSCULAR | Status: DC | PRN
  Administered 2016-08-13: 1 mg via INTRAVENOUS
  Filled 2016-08-13: qty 1

## 2016-08-13 MED ORDER — SUGAMMADEX SODIUM 200 MG/2ML IV SOLN
INTRAVENOUS | Status: AC
  Filled 2016-08-13: qty 2

## 2016-08-13 MED ORDER — CHLORHEXIDINE GLUCONATE CLOTH 2 % EX PADS: 6.0000 | MEDICATED_PAD | Freq: Once | CUTANEOUS | Status: DC

## 2016-08-13 MED ORDER — METOCLOPRAMIDE HCL 5 MG/ML IJ SOLN: 10.0000 mg | Freq: Once | INTRAMUSCULAR | Status: DC | PRN

## 2016-08-13 MED ORDER — BUPIVACAINE-EPINEPHRINE 0.25% -1:200000 IJ SOLN
INTRAMUSCULAR | Status: DC | PRN
  Administered 2016-08-13: 30 mL

## 2016-08-13 MED ORDER — SUGAMMADEX SODIUM 200 MG/2ML IV SOLN
INTRAVENOUS | Status: DC | PRN
  Administered 2016-08-13: 200 mg via INTRAVENOUS

## 2016-08-13 MED ORDER — ROCURONIUM BROMIDE 10 MG/ML (PF) SYRINGE
PREFILLED_SYRINGE | INTRAVENOUS | Status: AC
  Filled 2016-08-13: qty 5

## 2016-08-13 MED ORDER — CHLORHEXIDINE GLUCONATE CLOTH 2 % EX PADS
6.0000 | MEDICATED_PAD | Freq: Once | CUTANEOUS | Status: DC
  Administered 2016-08-13: 6 via TOPICAL

## 2016-08-13 MED ORDER — LIDOCAINE 2% (20 MG/ML) 5 ML SYRINGE
INTRAMUSCULAR | Status: AC
  Filled 2016-08-13: qty 5

## 2016-08-13 MED ORDER — ROCURONIUM BROMIDE 100 MG/10ML IV SOLN
INTRAVENOUS | Status: DC | PRN
  Administered 2016-08-13: 50 mg via INTRAVENOUS

## 2016-08-13 MED ORDER — IOPAMIDOL (ISOVUE-300) INJECTION 61%
INTRAVENOUS | Status: AC
  Filled 2016-08-13: qty 50

## 2016-08-13 MED ORDER — PROPOFOL 10 MG/ML IV BOLUS
INTRAVENOUS | Status: DC | PRN
  Administered 2016-08-13: 200 mg via INTRAVENOUS

## 2016-08-13 MED ORDER — ONDANSETRON HCL 4 MG/2ML IJ SOLN
INTRAMUSCULAR | Status: DC | PRN
  Administered 2016-08-13: 4 mg via INTRAVENOUS

## 2016-08-13 MED ORDER — STERILE WATER FOR IRRIGATION IR SOLN
Status: DC | PRN
  Administered 2016-08-13: 200 mL

## 2016-08-13 MED ORDER — FENTANYL CITRATE (PF) 250 MCG/5ML IJ SOLN
INTRAMUSCULAR | Status: AC
  Filled 2016-08-13: qty 5

## 2016-08-13 MED ORDER — PROPOFOL 10 MG/ML IV BOLUS
INTRAVENOUS | Status: AC
  Filled 2016-08-13: qty 20

## 2016-08-13 MED ORDER — FENTANYL CITRATE (PF) 100 MCG/2ML IJ SOLN
INTRAMUSCULAR | Status: DC | PRN
  Administered 2016-08-13 (×2): 50 ug via INTRAVENOUS
  Administered 2016-08-13: 100 ug via INTRAVENOUS
  Administered 2016-08-13 (×2): 50 ug via INTRAVENOUS

## 2016-08-13 MED ORDER — METRONIDAZOLE IN NACL 5-0.79 MG/ML-% IV SOLN
500.0000 mg | INTRAVENOUS | Status: DC
  Filled 2016-08-13: qty 100

## 2016-08-13 MED ORDER — LIDOCAINE HCL (CARDIAC) 20 MG/ML IV SOLN
INTRAVENOUS | Status: DC | PRN
  Administered 2016-08-13: 50 mg via INTRAVENOUS
  Administered 2016-08-13: 100 mg via INTRAVENOUS

## 2016-08-13 MED ORDER — LACTATED RINGERS IV SOLN: INTRAVENOUS | Status: DC

## 2016-08-13 MED ORDER — MIDAZOLAM HCL 5 MG/5ML IJ SOLN
INTRAMUSCULAR | Status: DC | PRN
  Administered 2016-08-13: 2 mg via INTRAVENOUS

## 2016-08-13 MED ORDER — MEPERIDINE HCL 25 MG/ML IJ SOLN: 6.2500 mg | INTRAMUSCULAR | Status: DC | PRN

## 2016-08-13 MED ORDER — BUPIVACAINE-EPINEPHRINE (PF) 0.25% -1:200000 IJ SOLN
INTRAMUSCULAR | Status: AC
  Filled 2016-08-13: qty 30

## 2016-08-13 MED ORDER — DEXAMETHASONE SODIUM PHOSPHATE 10 MG/ML IJ SOLN
INTRAMUSCULAR | Status: DC | PRN
  Administered 2016-08-13: 10 mg via INTRAVENOUS

## 2016-08-13 MED ORDER — FENTANYL CITRATE (PF) 100 MCG/2ML IJ SOLN: 25.0000 ug | INTRAMUSCULAR | Status: DC | PRN

## 2016-08-13 MED ORDER — SODIUM CHLORIDE 0.9 % IV SOLN
INTRAVENOUS | Status: DC
  Administered 2016-08-13 – 2016-08-14 (×2): via INTRAVENOUS

## 2016-08-13 MED ORDER — LACTATED RINGERS IV SOLN
INTRAVENOUS | Status: DC
  Administered 2016-08-13 (×2): via INTRAVENOUS

## 2016-08-13 MED ORDER — SODIUM CHLORIDE 0.9 % IV SOLN
INTRAVENOUS | Status: DC | PRN
  Administered 2016-08-13: 100 mL

## 2016-08-13 MED ORDER — 0.9 % SODIUM CHLORIDE (POUR BTL) OPTIME
TOPICAL | Status: DC | PRN
  Administered 2016-08-13: 1000 mL

## 2016-08-13 MED ORDER — ONDANSETRON HCL 4 MG/2ML IJ SOLN
INTRAMUSCULAR | Status: AC
  Filled 2016-08-13: qty 2

## 2016-08-13 MED ORDER — CIPROFLOXACIN IN D5W 400 MG/200ML IV SOLN
400.0000 mg | INTRAVENOUS | Status: DC
  Filled 2016-08-13: qty 200

## 2016-08-13 SURGICAL SUPPLY — 42 items
APPLIER CLIP 5 13 M/L LIGAMAX5 (MISCELLANEOUS) ×3
BLADE CLIPPER SURG (BLADE) IMPLANT
CANISTER SUCT 3000ML PPV (MISCELLANEOUS) ×3 IMPLANT
CHLORAPREP W/TINT 26ML (MISCELLANEOUS) ×3 IMPLANT
CLIP APPLIE 5 13 M/L LIGAMAX5 (MISCELLANEOUS) ×1 IMPLANT
COVER MAYO STAND STRL (DRAPES) ×3 IMPLANT
COVER SURGICAL LIGHT HANDLE (MISCELLANEOUS) ×3 IMPLANT
DERMABOND ADVANCED (GAUZE/BANDAGES/DRESSINGS) ×2
DERMABOND ADVANCED .7 DNX12 (GAUZE/BANDAGES/DRESSINGS) ×1 IMPLANT
DRAPE C-ARM 42X72 X-RAY (DRAPES) ×3 IMPLANT
ELECT REM PT RETURN 9FT ADLT (ELECTROSURGICAL) ×3
ELECTRODE REM PT RTRN 9FT ADLT (ELECTROSURGICAL) ×1 IMPLANT
GLOVE BIO SURGEON STRL SZ7 (GLOVE) ×3 IMPLANT
GLOVE BIO SURGEON STRL SZ7.5 (GLOVE) ×3 IMPLANT
GLOVE BIOGEL PI IND STRL 7.0 (GLOVE) ×1 IMPLANT
GLOVE BIOGEL PI IND STRL 7.5 (GLOVE) ×1 IMPLANT
GLOVE BIOGEL PI IND STRL 8 (GLOVE) ×1 IMPLANT
GLOVE BIOGEL PI INDICATOR 7.0 (GLOVE) ×2
GLOVE BIOGEL PI INDICATOR 7.5 (GLOVE) ×2
GLOVE BIOGEL PI INDICATOR 8 (GLOVE) ×2
GLOVE SURG SIGNA 7.5 PF LTX (GLOVE) ×3 IMPLANT
GOWN STRL REUS W/ TWL LRG LVL3 (GOWN DISPOSABLE) ×2 IMPLANT
GOWN STRL REUS W/ TWL XL LVL3 (GOWN DISPOSABLE) ×1 IMPLANT
GOWN STRL REUS W/TWL LRG LVL3 (GOWN DISPOSABLE) ×4
GOWN STRL REUS W/TWL XL LVL3 (GOWN DISPOSABLE) ×2
KIT BASIN OR (CUSTOM PROCEDURE TRAY) ×3 IMPLANT
KIT ROOM TURNOVER OR (KITS) ×3 IMPLANT
NS IRRIG 1000ML POUR BTL (IV SOLUTION) ×3 IMPLANT
PAD ARMBOARD 7.5X6 YLW CONV (MISCELLANEOUS) ×3 IMPLANT
POUCH SPECIMEN RETRIEVAL 10MM (ENDOMECHANICALS) ×3 IMPLANT
SCISSORS LAP 5X35 DISP (ENDOMECHANICALS) ×3 IMPLANT
SET CHOLANGIOGRAPH 5 50 .035 (SET/KITS/TRAYS/PACK) ×3 IMPLANT
SET IRRIG TUBING LAPAROSCOPIC (IRRIGATION / IRRIGATOR) ×3 IMPLANT
SLEEVE ENDOPATH XCEL 5M (ENDOMECHANICALS) ×6 IMPLANT
SPECIMEN JAR SMALL (MISCELLANEOUS) ×3 IMPLANT
SUT MNCRL AB 4-0 PS2 18 (SUTURE) ×3 IMPLANT
TOWEL OR 17X24 6PK STRL BLUE (TOWEL DISPOSABLE) ×3 IMPLANT
TOWEL OR 17X26 10 PK STRL BLUE (TOWEL DISPOSABLE) IMPLANT
TRAY LAPAROSCOPIC MC (CUSTOM PROCEDURE TRAY) ×3 IMPLANT
TROCAR XCEL BLUNT TIP 100MML (ENDOMECHANICALS) ×3 IMPLANT
TROCAR XCEL NON-BLD 5MMX100MML (ENDOMECHANICALS) ×3 IMPLANT
TUBING INSUFFLATION (TUBING) ×3 IMPLANT

## 2016-08-13 NOTE — ED Notes (Signed)
Pt c/o headache-- does not want oxycodone- states it makes her nauseated. Offered tylenol- pt states she will just wait.

## 2016-08-13 NOTE — Progress Notes (Addendum)
0840 Received pt from ED, A&O x4. Denies abd pain at this time. No N/V. NPO maint. 1000 Consent signed for Lap Chole today. Report given to pre op. Pt to preop area with family present.  1315 Received pt back from PACU. A&O x4. Lap sites to abd x3, with skin glue dry and intact. Medicated pt for pain.

## 2016-08-13 NOTE — ED Notes (Signed)
Report given to AvnetMarissa Serono, RN on [employer]6N

## 2016-08-13 NOTE — Anesthesia Postprocedure Evaluation (Signed)
Anesthesia Post Note  Patient: Sheri Reed  Procedure(s) Performed: Procedure(s) (LRB): LAPAROSCOPIC CHOLECYSTECTOMY WITH INTRAOPERATIVE CHOLANGIOGRAM (N/A)     Patient location during evaluation: PACU Anesthesia Type: General Level of consciousness: awake and alert Pain management: pain level controlled Vital Signs Assessment: post-procedure vital signs reviewed and stable Respiratory status: spontaneous breathing, nonlabored ventilation, respiratory function stable and patient connected to nasal cannula oxygen Cardiovascular status: blood pressure returned to baseline and stable Postop Assessment: no signs of nausea or vomiting Anesthetic complications: no    Last Vitals:  Vitals:   08/13/16 1300 08/13/16 1330  BP: (!) 145/92 (!) 149/86  Pulse: 80 80  Resp: 16 18  Temp: 37.2 C 36.7 C    Last Pain:  Vitals:   08/13/16 1335  TempSrc:   PainSc: 7                  Phillips Groutarignan, Scottlyn Mchaney

## 2016-08-13 NOTE — Anesthesia Preprocedure Evaluation (Signed)
Anesthesia Evaluation  Patient identified by MRN, date of birth, ID band Patient awake    Reviewed: Allergy & Precautions, NPO status , Patient's Chart, lab work & pertinent test results  Airway Mallampati: II  TM Distance: >3 FB Neck ROM: Full    Dental no notable dental hx.    Pulmonary asthma ,    Pulmonary exam normal breath sounds clear to auscultation       Cardiovascular negative cardio ROS Normal cardiovascular exam Rhythm:Regular Rate:Normal     Neuro/Psych negative neurological ROS  negative psych ROS   GI/Hepatic negative GI ROS, Neg liver ROS,   Endo/Other  negative endocrine ROS  Renal/GU negative Renal ROS  negative genitourinary   Musculoskeletal Lupus   Abdominal   Peds negative pediatric ROS (+)  Hematology negative hematology ROS (+)   Anesthesia Other Findings   Reproductive/Obstetrics negative OB ROS                            Anesthesia Physical Anesthesia Plan  ASA: II  Anesthesia Plan: General   Post-op Pain Management:    Induction: Intravenous  PONV Risk Score and Plan:   Airway Management Planned: Oral ETT  Additional Equipment:   Intra-op Plan:   Post-operative Plan: Extubation in OR  Informed Consent: I have reviewed the patients History and Physical, chart, labs and discussed the procedure including the risks, benefits and alternatives for the proposed anesthesia with the patient or authorized representative who has indicated his/her understanding and acceptance.   Dental advisory given  Plan Discussed with: CRNA  Anesthesia Plan Comments:         Anesthesia Quick Evaluation

## 2016-08-13 NOTE — Progress Notes (Signed)
Patient ID: Sheri Reed, female   DOB: 02/13/1965, 51 y.o.   MRN: [id number]   Plan lap chole today given improvement in her labs I discussed the procedure in detail.   We discussed the risks and benefits of a laparoscopic cholecystectomy and possible cholangiogram including, but not limited to bleeding, infection, injury to surrounding structures such as the intestine or liver, bile leak, retained gallstones, need to convert to an open procedure, prolonged diarrhea, blood clots such as  DVT, common bile duct injury, anesthesia risks, and possible need for additional procedures.  The likelihood of improvement in symptoms and return to the patient's normal status is good. We discussed the typical post-operative recovery course.

## 2016-08-13 NOTE — Op Note (Signed)
Laparoscopic Cholecystectomy with IOC Procedure Note  Indications: This patient presents with symptomatic gallbladder disease and will undergo laparoscopic cholecystectomy.  Pre-operative Diagnosis: acute cholecystitis with cholelithiasis  Post-operative Diagnosis: Same  Surgeon: Abigail Miyamoto A   Assistants: 0  Anesthesia: General endotracheal anesthesia  ASA Class: 2  Procedure Details  The patient was seen again in the Holding Room. The risks, benefits, complications, treatment options, and expected outcomes were discussed with the patient. The possibilities of reaction to medication, pulmonary aspiration, perforation of viscus, bleeding, recurrent infection, finding a normal gallbladder, the need for additional procedures, failure to diagnose a condition, the possible need to convert to an open procedure, and creating a complication requiring transfusion or operation were discussed with the patient. The likelihood of improving the patient's symptoms with return to their baseline status is good.  The patient and/or family concurred with the proposed plan, giving informed consent. The site of surgery properly noted. The patient was taken to Operating Room, identified as Sheri Reed and the procedure verified as Laparoscopic Cholecystectomy with Intraoperative Cholangiogram. A Time Out was held and the above information confirmed.  Prior to the induction of general anesthesia, antibiotic prophylaxis was administered. General endotracheal anesthesia was then administered and tolerated well. After the induction, the abdomen was prepped with Chloraprep and draped in the sterile fashion. The patient was positioned in the supine position.  Local anesthetic agent was injected into the skin near the umbilicus and an incision made. We dissected down to the abdominal fascia with blunt dissection.  The fascia was incised vertically and we entered the peritoneal cavity bluntly.  A pursestring suture of  0-Vicryl was placed around the fascial opening.  The Hasson cannula was inserted and secured with the stay suture.  Pneumoperitoneum was then created with CO2 and tolerated well without any adverse changes in the patient's vital signs. A 5-mm port was placed in the subxiphoid position.  Two 5-mm ports were placed in the right upper quadrant. All skin incisions were infiltrated with a local anesthetic agent before making the incision and placing the trocars.   We positioned the patient in reverse Trendelenburg, tilted slightly to the patient's left.  The gallbladder was identified, the fundus grasped and retracted cephalad. Adhesions were lysed bluntly and with the electrocautery where indicated, taking care not to injure any adjacent organs or viscus. The infundibulum was grasped and retracted laterally, exposing the peritoneum overlying the triangle of Calot. This was then divided and exposed in a blunt fashion. A critical view of the cystic duct and cystic artery was obtained.  The cystic duct was clearly identified and bluntly dissected circumferentially. The cystic duct was ligated with a clip distally.   An incision was made in the cystic duct and the [facility] cholangiogram catheter introduced. The catheter was secured using a clip. A cholangiogram was then obtained which showed good visualization of the distal and proximal biliary tree with no sign of filling defects or obstruction.  Contrast flowed easily into the duodenum. The catheter was then removed.   The cystic duct was then ligated with clips and divided. The cystic artery was identified, dissected free, ligated with clips and divided as well.   The gallbladder was dissected from the liver bed in retrograde fashion with the electrocautery. The gallbladder was removed and placed in an Endocatch sac. The liver bed was irrigated and inspected. Hemostasis was achieved with the electrocautery. Copious irrigation was utilized and was repeatedly aspirated  until clear.  The gallbladder and Endocatch sac were  then removed through the umbilical port site.  The pursestring suture was used to close the umbilical fascia.    We again inspected the right upper quadrant for hemostasis.  Pneumoperitoneum was released as we removed the trocars.  4-0 Monocryl was used to close the skin.  Skin glue was then applied. The patient was then extubated and brought to the recovery room in stable condition. Instrument, sponge, and needle counts were correct at closure and at the conclusion of the case.   Findings: Cholecystitis with Cholelithiasis Normal cholangiogram  Estimated Blood Loss: Minimal         Drains: 0         Specimens: Gallbladder           Complications: None; patient tolerated the procedure well.         Disposition: PACU - hemodynamically stable.         Condition: stable

## 2016-08-13 NOTE — Discharge Instructions (Signed)

## 2016-08-13 NOTE — Anesthesia Procedure Notes (Signed)
Procedure Name: Intubation Date/Time: 08/13/2016 11:26 AM Performed by: Oletta Lamas Pre-anesthesia Checklist: Patient identified, Emergency Drugs available, Suction available and Patient being monitored Patient Re-evaluated:Patient Re-evaluated prior to induction Oxygen Delivery Method: Circle System Utilized Preoxygenation: Pre-oxygenation with 100% oxygen Induction Type: IV induction Ventilation: Mask ventilation without difficulty Laryngoscope Size: Mac and 3 Grade View: Grade III Tube type: Oral Number of attempts: 1 Airway Equipment and Method: Stylet Placement Confirmation: ETT inserted through vocal cords under direct vision,  positive ETCO2 and breath sounds checked- equal and bilateral Secured at: 21 cm Tube secured with: Tape Dental Injury: Teeth and Oropharynx as per pre-operative assessment

## 2016-08-13 NOTE — Transfer of Care (Signed)
Immediate Anesthesia Transfer of Care Note  Patient: Sheri Reed  Procedure(s) Performed: Procedure(s): LAPAROSCOPIC CHOLECYSTECTOMY WITH INTRAOPERATIVE CHOLANGIOGRAM (N/A)  Patient Location: PACU  Anesthesia Type:General  Level of Consciousness: awake and alert   Airway & Oxygen Therapy: Patient Spontanous Breathing and Patient connected to nasal cannula oxygen  Post-op Assessment: Report given to RN and Post -op Vital signs reviewed and stable  Post vital signs: Reviewed and stable  Last Vitals:  Vitals:   08/13/16 0840 08/13/16 1220  BP: (!) 147/91 (!) 143/86  Pulse: 87 99  Resp: 18 16  Temp: 36.8 C 36.5 C    Last Pain:  Vitals:   08/13/16 1220  TempSrc:   PainSc: 0-No pain         Complications: No apparent anesthesia complications

## 2016-08-13 NOTE — ED Notes (Signed)
Family contacts - Erie NoeVanessa (sister) [phone] Donell BeersVernita (sister) - [phone]

## 2016-08-14 ENCOUNTER — Encounter (HOSPITAL_COMMUNITY): Payer: Self-pay | Admitting: Surgery

## 2016-08-14 ENCOUNTER — Encounter (HOSPITAL_COMMUNITY): Payer: Self-pay

## 2016-08-14 DIAGNOSIS — M329 Systemic lupus erythematosus, unspecified: Secondary | ICD-10-CM | POA: Diagnosis not present

## 2016-08-14 DIAGNOSIS — K810 Acute cholecystitis: Secondary | ICD-10-CM | POA: Diagnosis not present

## 2016-08-14 DIAGNOSIS — R7989 Other specified abnormal findings of blood chemistry: Secondary | ICD-10-CM

## 2016-08-14 DIAGNOSIS — K219 Gastro-esophageal reflux disease without esophagitis: Secondary | ICD-10-CM

## 2016-08-14 DIAGNOSIS — J45909 Unspecified asthma, uncomplicated: Secondary | ICD-10-CM

## 2016-08-14 DIAGNOSIS — J4520 Mild intermittent asthma, uncomplicated: Secondary | ICD-10-CM | POA: Diagnosis not present

## 2016-08-14 LAB — HEPATIC FUNCTION PANEL
ALK PHOS: 142 U/L — AB (ref 38–126)
ALT: 167 U/L — AB (ref 14–54)
AST: 65 U/L — AB (ref 15–41)
Albumin: 3.2 g/dL — ABNORMAL LOW (ref 3.5–5.0)
BILIRUBIN DIRECT: 0.4 mg/dL (ref 0.1–0.5)
BILIRUBIN INDIRECT: 0.7 mg/dL (ref 0.3–0.9)
TOTAL PROTEIN: 6 g/dL — AB (ref 6.5–8.1)
Total Bilirubin: 1.1 mg/dL (ref 0.3–1.2)

## 2016-08-14 MED ORDER — SIMETHICONE 80 MG PO CHEW: 80.0000 mg | CHEWABLE_TABLET | Freq: Four times a day (QID) | ORAL | 0 refills | Status: AC | PRN

## 2016-08-14 MED ORDER — IBUPROFEN 800 MG PO TABS: 800.0000 mg | ORAL_TABLET | Freq: Three times a day (TID) | ORAL | 0 refills | Status: DC | PRN

## 2016-08-14 MED ORDER — OXYCODONE HCL 5 MG PO TABS: 5.0000 mg | ORAL_TABLET | ORAL | Status: DC | PRN

## 2016-08-14 MED ORDER — ENOXAPARIN SODIUM 40 MG/0.4ML ~~LOC~~ SOLN: 40.0000 mg | SUBCUTANEOUS | Status: DC

## 2016-08-14 NOTE — Progress Notes (Signed)
[facility]/Trauma Progress Note  1 Day Post-Op   Assessment/Plan  Asthma Lupus - followed by Dr. Lendon ColonelHawks, on home plaquenil  Cholecystitis - S/P lap chole, Dr. Magnus IvanBlackman, 08/08 - LFT's trending down, Tbili normal  FEN: reg diet VTE: SCD's, lovenox ID: flagyl 08/07-08/08, cipro 08/08-08/09 Follow up: [facility] for f/u in 2-3 weeks (will set up prior to discharge)  DISPO: If pt tolerates breakfast and has flatus she will be clear for discharge today. No PO abx needed. Stopped Cirpo and IVF.    LOS: 2 days    Subjective:  CC: abdominal soreness  Pt had nausea and vomiting after surgery. She tolerated clear liquids last night. No nausea or vomiting today. No fevers overnight. Pt has been up to urinate. No new complaints.   Objective: Vital signs in last 24 hours: Temp:  [97.7 F (36.5 C)-98.9 F (37.2 C)] 98.8 F (37.1 C) (08/09 1021) Pulse Rate:  [74-99] 91 (08/09 1021) Resp:  [13-18] 18 (08/09 1021) BP: (138-155)/(74-97) 139/74 (08/09 1021) SpO2:  [94 %-100 %] 97 % (08/09 1021) Last BM Date: 08/11/16  Intake/Output from previous day: 08/08 0701 - 08/09 0700 In: 3857.5 [P.O.:790; I.V.:2867.5; IV Piggyback:200] Out: 410 [Urine:400; Blood:10] Intake/Output this shift: Total I/O In: 120 [P.O.:120] Out: -   PE: Gen:  Alert, NAD, pleasant, cooperative, well appearing Card:  RRR, no M/G/R heard Pulm:  Rate and effort normal Abd: Soft, not distended, +BS, incisions with glue without surrounding erythema, mild TTP in RUQ Skin: warm and dry   Anti-infectives: Anti-infectives    Start     Dose/Rate Route Frequency Ordered Stop   08/13/16 1100  ciprofloxacin (CIPRO) IVPB 400 mg  Status:  Discontinued     400 mg 200 mL/hr over 60 Minutes Intravenous On call to O.R. 08/13/16 0902 08/13/16 1314   08/13/16 1100  metroNIDAZOLE (FLAGYL) IVPB 500 mg  Status:  Discontinued     500 mg 100 mL/hr over 60 Minutes Intravenous On call to O.R. 08/13/16 0902 08/13/16  1314   08/12/16 2000  metroNIDAZOLE (FLAGYL) IVPB 500 mg  Status:  Discontinued     500 mg 100 mL/hr over 60 Minutes Intravenous Every 8 hours 08/12/16 1925 08/13/16 1317   08/12/16 2000  ciprofloxacin (CIPRO) IVPB 400 mg     400 mg 200 mL/hr over 60 Minutes Intravenous Every 12 hours 08/12/16 1925        Lab Results:   Recent Labs  08/12/16 1603 08/13/16 0225  WBC 6.9 5.2  HGB 12.7 11.0*  HCT 40.3 35.6*  PLT 306 301   BMET  Recent Labs  08/12/16 1603 08/13/16 0225  NA 137 139  K 3.7 4.2  CL 104 109  CO2 20* 22  GLUCOSE 85 94  BUN 8 7  CREATININE 0.68 0.59  CALCIUM 9.2 8.4*   PT/INR No results for input(s): LABPROT, INR in the last 72 hours. CMP     Component Value Date/Time   NA 139 08/13/2016 0225   K 4.2 08/13/2016 0225   CL 109 08/13/2016 0225   CO2 22 08/13/2016 0225   GLUCOSE 94 08/13/2016 0225   BUN 7 08/13/2016 0225   CREATININE 0.59 08/13/2016 0225   CALCIUM 8.4 (L) 08/13/2016 0225   PROT 6.0 (L) 08/14/2016 0830   ALBUMIN 3.2 (L) 08/14/2016 0830   AST 65 (H) 08/14/2016 0830   ALT 167 (H) 08/14/2016 0830   ALKPHOS 142 (H) 08/14/2016 0830   BILITOT 1.1 08/14/2016 0830   GFRNONAA >60  08/13/2016 0225   GFRAA >60 08/13/2016 0225   Lipase     Component Value Date/Time   LIPASE 30 08/13/2016 0225    Studies/Results: Dg Cholangiogram Operative  Result Date: 08/13/2016 CLINICAL DATA:  51 year old female with a history of cholelithiasis EXAM: INTRAOPERATIVE CHOLANGIOGRAM TECHNIQUE: Cholangiographic images from the C-arm fluoroscopic device were submitted for interpretation post-operatively. Please see the procedural report for the amount of contrast and the fluoroscopy time utilized. COMPARISON:  None. FINDINGS: Surgical instruments project over the upper abdomen. There is cannulation of the cystic duct/gallbladder neck, with antegrade infusion of contrast. Caliber of the extrahepatic ductal system within normal limits. Vague rounded filling defects  within the proximal hepatic duct/distal left sectoral duct, potentially gas bubble or retained debris/stone Free flow of contrast across the ampulla. IMPRESSION: Intraoperative cholangiogram demonstrates extrahepatic biliary ducts of unremarkable caliber with free flow contrast across the ampulla. There is a vague rounded filling defect within the proximal common hepatic duct/distal left sectoral duct, potentially a gas bubble or retained debris/stone. Please refer to the dictated operative report for full details of intraoperative findings and procedure Electronically Signed   By: Gilmer Mor D.O.   On: 08/13/2016 12:58   US Abdomen Complete  Result Date: 08/12/2016 CLINICAL DATA:  Upper abdominal pain EXAM: ABDOMEN ULTRASOUND COMPLETE COMPARISON:  None. FINDINGS: Gallbladder: Within the gallbladder, there are multiple echogenic foci which move and shadow consistent with cholelithiasis. Largest individual gallstone measures 5 mm in length. There is diffuse gallbladder wall thickening with edema and pericholecystic fluid. Patient is focally tender over the gallbladder. Common bile duct: Diameter: 5 mm. There is no intrahepatic, common hepatic, or common bile duct dilatation. Liver: No focal lesion identified. Within normal limits in parenchymal echogenicity. Flow in the portal vein is in the anatomic direction. IVC: No abnormality visualized in portions which can be interrogated. Most of the inferior vena cava is obscured by gas. Pancreas: Visualized portion unremarkable. Much of the pancreas is obscured by gas. Spleen: Size and appearance within normal limits. Right Kidney: Length: 11.0 cm. Echogenicity within normal limits. No mass or hydronephrosis visualized. Left Kidney: Length: 11.4 cm. Echogenicity within normal limits. No mass or hydronephrosis visualized. Abdominal aorta: No aneurysm visualized. Portions of aorta obscured by gas. Other findings: No demonstrable ascites. IMPRESSION: 1. Gallstones with  edematous, thickened gallbladder wall and pericholecystic fluid. Focal tenderness over the gallbladder. These findings are indicative of acute cholecystitis. 2. Portions of the pancreas, aorta, inferior vena cava are obscured by gas. Visualized portions of these structures appear unremarkable. 3.  Study otherwise unremarkable. Electronically Signed   By: Bretta Bang III M.D.   On: 08/12/2016 14:53      Jerre Simon , [facility] Surgery 08/14/2016, 10:28 AM Pager: [phone] Consults: [phone] Mon-Fri 7:00 am-4:30 pm Sat-Sun 7:00 am-11:30 am

## 2016-08-14 NOTE — Progress Notes (Signed)
Sheri Reed to be D/C'd  per MD order. Discussed with the patient and all questions fully answered.  VSS, Skin clean, dry and intact without evidence of skin break down, no evidence of skin tears noted.  IV catheter discontinued intact. Site without signs and symptoms of complications. Dressing and pressure applied.  An After Visit Summary was printed and given to the patient. Patient received prescription.  D/c education completed with patient/family including follow up instructions, medication list, d/c activities limitations if indicated, with other d/c instructions as indicated by MD - patient able to verbalize understanding, all questions fully answered.   Patient instructed to return to ED, call 911, or call MD for any changes in condition.   Patient to be escorted via WC, and D/C home via private auto.

## 2016-08-14 NOTE — Discharge Summary (Signed)
Physician Discharge Summary  Sheri Reed WUJ:[id number] DOB: 09/30/1965 DOA: 08/12/2016  PCP: Milus Height, PA-C  Admit date: 08/12/2016 Discharge date: 08/14/2016  Time spent: 35 minutes  Recommendations for Outpatient Follow-up:  1. 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 Discharge home with instructions to follow up with PCP and with general surgery after discharge.   Diet recommendation: low fat and low calorie diet   Filed Weights   08/12/16 1549 08/13/16 0840  Weight: 81.6 kg (180 lb) 81.6 kg (180 lb)    History of present illness:  51 y.o.femalewith medical history significant of asthma, SLE; who Presented with intermittent abdominal pain: Few months now worsened and right upper quadrant and epigastric area usually after eating. Especially if she eats anything fatty. She had severe episode yesterday and she couldn't sleep all night presented to her primary care provider who did an ultrasound showed gallstones and thickened gallbladder. Admitted for further treatment of cholecystitis. Also found with transaminitis.  Hospital Course:  1-acute cholecystitis  -no nausea, no vomiting and no fever  -s/p laparoscopic cholecystectomy  -expected well controlled abd pain -tolerating diet and passed gas prior to discharge  -patient discharge on ibuprofen for pain and X-gas for flatulence and obstipation. -will follow up with general surgery as an outpatient    2-elevated LFT's -due to cholecystitis  -LFT's continue trending down -no icterus -normal Bilirubin  -repeat liver function test at follow up visit to follow trend   3-asthma -stable and well controlled -continue albuterol  PRN  4-Lupus -resume plaquenil at discharge -continue outpatient follow up with Dr. Lendon Colonel  5-GERD -will resume prilosec  6-obesity -Body mass index is 35.15 kg/m. -low calorie diet and exercise discussed with patient.   Procedures:  Laparoscopic cholecystectomy 08/13/16  See below for x-ray reports   Consultations:  General surgery   Discharge Exam: Vitals:   08/14/16 0507 08/14/16 1021  BP: (!) 150/80 139/74  Pulse: 98 91  Resp: 18 18  Temp: 98.5 F (36.9 C) 98.8 F (37.1 C)  SpO2: 97% 97%    General:  Afebrile, tolerating diet, no nausea, no vomiting and with controlled abd pain.   Cardiovascular: S1 and S2, no rubs and no gallops  Respiratory: CTA bilaterally  Abdomen: expected after surgery discomfort around ports and RUQ; no guarding, positive BS. Patient reported passing gas.   Musculoskeletal: no edema, no cyanosis   Discharge Instructions   Discharge Instructions    Discharge instructions    Complete by:  As directed    Slowly advance diet to low fat Keep yourself well hydrated Follow up with general surgery as instructed  Arrange follow up with PCP in 10 days   Increase activity slowly    Complete by:  As directed      Current Discharge Medication List    START taking these medications   Details  ibuprofen (ADVIL,MOTRIN) 800 MG tablet Take 1 tablet (800 mg total) by mouth every 8 (eight) hours as needed for fever, headache or moderate pain. Qty: 40 tablet, Refills: 0    simethicone (GAS-X) 80 MG chewable tablet Chew 1 tablet (80 mg total) by mouth every 6 (six) hours as needed for flatulence. Qty: 50 tablet,  Refills: 0      CONTINUE these medications which have NOT CHANGED   Details  Cholecalciferol (VITAMIN D3) 400 units CAPS Take 40 Units by mouth daily.    Flaxseed, Linseed, (FLAX SEEDS PO) Take 1 tablet by mouth daily.    hydroxychloroquine (PLAQUENIL) 200 MG tablet Take 400 mg by mouth daily. Refills: 1    NU-IRON 150 MG  capsule Take 150 mg by mouth daily. Refills: 12    omeprazole (PRILOSEC) 20 MG capsule Take 40 mg by mouth daily. Refills: 1    progesterone (PROMETRIUM) 200 MG capsule Take 200 mg by mouth at bedtime. Refills: 2    vitamin B-12 (CYANOCOBALAMIN) 100 MCG tablet Take 100 mcg by mouth daily.      STOP taking these medications     HYDROcodone-acetaminophen (NORCO/VICODIN) 5-325 MG per tablet      metaxalone (SKELAXIN) 800 MG tablet        Allergies  Allergen Reactions  . Aspirin   . Betadine [Povidone Iodine]   . Codeine   . Dust Mite Extract   . Penicillins     Has patient had a PCN reaction causing immediate rash, facial/tongue/throat swelling, SOB or lightheadedness with hypotension: No Has patient had a PCN reaction causing severe rash involving mucus membranes or skin necrosis: No Has patient had a PCN reaction that required hospitalization: No Has patient had a PCN reaction occurring within the last 10 years: No If all of the above answers are "NO", then may proceed with Cephalosporin use.  Gaetana Michaelis Antibiotics    Follow-up Information    [facility], [place]. Call on 08/28/2016.   Specialty:  General Surgery Why:  Your appointment is 08/28/16 at 1:45 pm. Please arrive at least 30 min before your appointment to complete your check in paperwork. Contact information: [address] [place] [address] [phone]       Milus Height, [place]. Schedule an appointment as soon as possible for a visit in 10 day(s).   Specialty:  Nurse Practitioner Contact information: [address] [place] [address] [phone]           The results of significant diagnostics from this hospitalization (including imaging, microbiology, ancillary and laboratory) are listed below for reference.    Significant Diagnostic Studies: Dg Cholangiogram Operative  Result Date: 08/13/2016 CLINICAL DATA:  51 year old female with a  history of cholelithiasis EXAM: INTRAOPERATIVE CHOLANGIOGRAM TECHNIQUE: Cholangiographic images from the C-arm fluoroscopic device were submitted for interpretation post-operatively. Please see the procedural report for the amount of contrast and the fluoroscopy time utilized. COMPARISON:  None. FINDINGS: Surgical instruments project over the upper abdomen. There is cannulation of the cystic duct/gallbladder neck, with antegrade infusion of contrast. Caliber of the extrahepatic ductal system within normal limits. Vague rounded filling defects within the proximal hepatic duct/distal left sectoral duct, potentially gas bubble or retained debris/stone Free flow of contrast across the ampulla. IMPRESSION: Intraoperative cholangiogram demonstrates extrahepatic biliary ducts of unremarkable caliber with free flow contrast across the ampulla. There is a vague rounded filling defect within the proximal common hepatic duct/distal left sectoral duct, potentially a gas bubble or retained debris/stone. Please refer to the dictated operative report for full details of intraoperative findings and procedure Electronically Signed   By: Gilmer Mor D.O.   On: 08/13/2016 12:58   US Abdomen Complete  Result Date: 08/12/2016 CLINICAL DATA:  Upper abdominal pain EXAM: ABDOMEN ULTRASOUND COMPLETE COMPARISON:  None. FINDINGS: Gallbladder: Within the  gallbladder, there are multiple echogenic foci which move and shadow consistent with cholelithiasis. Largest individual gallstone measures 5 mm in length. There is diffuse gallbladder wall thickening with edema and pericholecystic fluid. Patient is focally tender over the gallbladder. Common bile duct: Diameter: 5 mm. There is no intrahepatic, common hepatic, or common bile duct dilatation. Liver: No focal lesion identified. Within normal limits in parenchymal echogenicity. Flow in the portal vein is in the anatomic direction. IVC: No abnormality visualized in portions which can be  interrogated. Most of the inferior vena cava is obscured by gas. Pancreas: Visualized portion unremarkable. Much of the pancreas is obscured by gas. Spleen: Size and appearance within normal limits. Right Kidney: Length: 11.0 cm. Echogenicity within normal limits. No mass or hydronephrosis visualized. Left Kidney: Length: 11.4 cm. Echogenicity within normal limits. No mass or hydronephrosis visualized. Abdominal aorta: No aneurysm visualized. Portions of aorta obscured by gas. Other findings: No demonstrable ascites. IMPRESSION: 1. Gallstones with edematous, thickened gallbladder wall and pericholecystic fluid. Focal tenderness over the gallbladder. These findings are indicative of acute cholecystitis. 2. Portions of the pancreas, aorta, inferior vena cava are obscured by gas. Visualized portions of these structures appear unremarkable. 3.  Study otherwise unremarkable. Electronically Signed   By: Bretta BangWilliam  Woodruff III M.D.   On: 08/12/2016 14:53    Microbiology: Recent Results (from the past 240 hour(s))  Surgical pcr screen     Status: Abnormal   Collection Time: 08/13/16  9:53 AM  Result Value Ref Range Status   MRSA, PCR NEGATIVE NEGATIVE Final   Staphylococcus aureus POSITIVE (A) NEGATIVE Final    Comment:        The Xpert SA Assay (FDA approved for NASAL specimens in patients over 921 years of age), is one component of a comprehensive surveillance program.  Test performance has been validated by [facility] for patients greater than or equal to 53 year old. It is not intended to diagnose infection nor to guide or monitor treatment.      Labs: Basic Metabolic Panel:  Recent Labs Lab 08/12/16 1603 08/13/16 0225  NA 137 139  K 3.7 4.2  CL 104 109  CO2 20* 22  GLUCOSE 85 94  BUN 8 7  CREATININE 0.68 0.59  CALCIUM 9.2 8.4*  MG  --  2.0  PHOS  --  3.9   Liver Function Tests:  Recent Labs Lab 08/12/16 1603 08/13/16 0225 08/14/16 0830  AST 376* 146* 65*  ALT 430* 283*  167*  ALKPHOS 224* 171* 142*  BILITOT 1.3* 0.9 1.1  PROT 8.1 6.8 6.0*  ALBUMIN 4.4 3.6 3.2*    Recent Labs Lab 08/12/16 1603 08/13/16 0225  LIPASE 141* 30   CBC:  Recent Labs Lab 08/12/16 1603 08/13/16 0225  WBC 6.9 5.2  HGB 12.7 11.0*  HCT 40.3 35.6*  MCV 85.2 86.4  PLT 306 301    Signed:  Vassie LollMadera, Bettymae Yott MD.  [facility] 08/14/2016, 2:58 PM

## 2016-08-14 NOTE — Progress Notes (Signed)
[facility] PROGRESS NOTE  Sheri Reed ZOX:[id number] DOB: 07/22/1965 DOA: 08/12/2016 PCP: Milus Heightedmon, Noelle, PA-C  Interim summary and HPI 51 y.o. female with medical history significant of asthma, SLE; who Presented with intermittent abdominal pain: Few months now worsened and right upper quadrant and epigastric area usually after eating. Especially if she eats anything fatty. She had severe episode yesterday and she couldn't sleep all night presented to her primary care provider who did an ultrasound showed gallstones and thickened gallbladder. Admitted for further treatment of cholecystitis. Also found with transaminitis.   Assessment/Plan: 1-acute cholecystitis  -no nausea, no vomiting and no fever currently -s/p laparoscopic cholecystectomy  -will follow [facility] rec's   2-elevated LFT's -due to cholecystitis  -trending down -no icterus  3-asthma -stable and well controlled -continue albuterol  4-Lupus -resume plaquenil when able to take PO -continue outpatient follow up with Dr. Lendon ColonelHawks  Code Status: Full Family Communication: sister at bedside  Disposition Plan: to be determined by general surgery; patient status post cholecystectomy and is hemodynamically stable.   Consultants:  [facility]  Procedures:  Laparoscopic cholecystectomy 08/13/16  Antibiotics:  Ciprofloxacin (stopped on 8/8)  HPI/Subjective: Afebrile, no CP, no SOB. Somnolent after surgery.  Objective: Vitals:   08/13/16 1841 08/13/16 2150  BP: (!) 145/74 138/83  Pulse: 80 99  Resp: 18 16  Temp: 98.3 F (36.8 C) 98.8 F (37.1 C)    Intake/Output Summary (Last 24 hours) at 08/14/16 0012 Last data filed at 08/13/16 2151  Gross per 24 hour  Intake             3010 ml  Output              410 ml  Net             2600 ml   Filed Weights   08/12/16 1549 08/13/16 0840  Weight: 81.6 kg (180 lb) 81.6 kg (180 lb)    Exam:   General:  Afebrile, somnolent after surgery; in no acute  distress.  Cardiovascular: S1 and S2, no rubs and no gallops  Respiratory: CTA bilaterally  Abdomen: slightly distended after surgery, no guarding, no BS  Musculoskeletal: no edema, no cyanosis   Data Reviewed: Basic Metabolic Panel:  Recent Labs Lab 08/12/16 1603 08/13/16 0225  NA 137 139  K 3.7 4.2  CL 104 109  CO2 20* 22  GLUCOSE 85 94  BUN 8 7  CREATININE 0.68 0.59  CALCIUM 9.2 8.4*  MG  --  2.0  PHOS  --  3.9   Liver Function Tests:  Recent Labs Lab 08/12/16 1603 08/13/16 0225  AST 376* 146*  ALT 430* 283*  ALKPHOS 224* 171*  BILITOT 1.3* 0.9  PROT 8.1 6.8  ALBUMIN 4.4 3.6    Recent Labs Lab 08/12/16 1603 08/13/16 0225  LIPASE 141* 30   CBC:  Recent Labs Lab 08/12/16 1603 08/13/16 0225  WBC 6.9 5.2  HGB 12.7 11.0*  HCT 40.3 35.6*  MCV 85.2 86.4  PLT 306 301    CBG: No results for input(s): GLUCAP in the last 168 hours.  Recent Results (from the past 240 hour(s))  Surgical pcr screen     Status: Abnormal   Collection Time: 08/13/16  9:53 AM  Result Value Ref Range Status   MRSA, PCR NEGATIVE NEGATIVE Final   Staphylococcus aureus POSITIVE (A) NEGATIVE Final    Comment:        The Xpert SA Assay (FDA approved for NASAL specimens in patients over  79 years of age), is one component of a comprehensive surveillance program.  Test performance has been validated by [facility] for patients greater than or equal to 47 year old. It is not intended to diagnose infection nor to guide or monitor treatment.      Studies: Dg Cholangiogram Operative  Result Date: 08/13/2016 CLINICAL DATA:  51 year old female with a history of cholelithiasis EXAM: INTRAOPERATIVE CHOLANGIOGRAM TECHNIQUE: Cholangiographic images from the C-arm fluoroscopic device were submitted for interpretation post-operatively. Please see the procedural report for the amount of contrast and the fluoroscopy time utilized. COMPARISON:  None. FINDINGS: Surgical instruments  project over the upper abdomen. There is cannulation of the cystic duct/gallbladder neck, with antegrade infusion of contrast. Caliber of the extrahepatic ductal system within normal limits. Vague rounded filling defects within the proximal hepatic duct/distal left sectoral duct, potentially gas bubble or retained debris/stone Free flow of contrast across the ampulla. IMPRESSION: Intraoperative cholangiogram demonstrates extrahepatic biliary ducts of unremarkable caliber with free flow contrast across the ampulla. There is a vague rounded filling defect within the proximal common hepatic duct/distal left sectoral duct, potentially a gas bubble or retained debris/stone. Please refer to the dictated operative report for full details of intraoperative findings and procedure Electronically Signed   By: Gilmer Mor D.O.   On: 08/13/2016 12:58   US Abdomen Complete  Result Date: 08/12/2016 CLINICAL DATA:  Upper abdominal pain EXAM: ABDOMEN ULTRASOUND COMPLETE COMPARISON:  None. FINDINGS: Gallbladder: Within the gallbladder, there are multiple echogenic foci which move and shadow consistent with cholelithiasis. Largest individual gallstone measures 5 mm in length. There is diffuse gallbladder wall thickening with edema and pericholecystic fluid. Patient is focally tender over the gallbladder. Common bile duct: Diameter: 5 mm. There is no intrahepatic, common hepatic, or common bile duct dilatation. Liver: No focal lesion identified. Within normal limits in parenchymal echogenicity. Flow in the portal vein is in the anatomic direction. IVC: No abnormality visualized in portions which can be interrogated. Most of the inferior vena cava is obscured by gas. Pancreas: Visualized portion unremarkable. Much of the pancreas is obscured by gas. Spleen: Size and appearance within normal limits. Right Kidney: Length: 11.0 cm. Echogenicity within normal limits. No mass or hydronephrosis visualized. Left Kidney: Length: 11.4 cm.  Echogenicity within normal limits. No mass or hydronephrosis visualized. Abdominal aorta: No aneurysm visualized. Portions of aorta obscured by gas. Other findings: No demonstrable ascites. IMPRESSION: 1. Gallstones with edematous, thickened gallbladder wall and pericholecystic fluid. Focal tenderness over the gallbladder. These findings are indicative of acute cholecystitis. 2. Portions of the pancreas, aorta, inferior vena cava are obscured by gas. Visualized portions of these structures appear unremarkable. 3.  Study otherwise unremarkable. Electronically Signed   By: Bretta Bang III M.D.   On: 08/12/2016 14:53    Scheduled Meds: . Chlorhexidine Gluconate Cloth  6 each Topical Daily  . docusate sodium  100 mg Oral BID  . mupirocin ointment  1 application Nasal BID   Continuous Infusions: . sodium chloride 75 mL/hr at 08/13/16 1813  . ciprofloxacin Stopped (08/13/16 2119)  . lactated ringers      Active Problems:   Acute cholecystitis   Elevated LFTs   Cholecystitis   Lupus   Asthma    Time spent: 25 minutes    Vassie Loll  [facility] Pager [phone]. If 7PM-7AM, please contact night-coverage at [email or web address], password [facility] 08/14/2016, 12:12 AM  LOS: 2 days

## 2016-09-12 ENCOUNTER — Ambulatory Visit: Payer: 59

## 2016-09-12 ENCOUNTER — Ambulatory Visit
Admission: RE | Admit: 2016-09-12 | Discharge: 2016-09-12 | Disposition: A | Discharge status: Home / Self Care | Payer: 59 | Source: Ambulatory Visit | Attending: Obstetrics and Gynecology | Admitting: Obstetrics and Gynecology

## 2016-09-12 DIAGNOSIS — Z1231 Encounter for screening mammogram for malignant neoplasm of breast: Secondary | ICD-10-CM

## 2016-10-01 ENCOUNTER — Encounter (HOSPITAL_COMMUNITY): Admission: RE | Payer: Self-pay | Source: Ambulatory Visit

## 2016-10-01 ENCOUNTER — Ambulatory Visit (HOSPITAL_COMMUNITY): Admission: RE | Admit: 2016-10-01 | Payer: 59 | Source: Ambulatory Visit | Admitting: Obstetrics and Gynecology

## 2016-10-01 SURGERY — DILATATION AND CURETTAGE /HYSTEROSCOPY
Anesthesia: Choice

## 2016-11-20 ENCOUNTER — Inpatient Hospital Stay (HOSPITAL_COMMUNITY)
Admission: RE | Admit: 2016-11-20 | Discharge: 2016-11-20 | Disposition: A | Discharge status: Home / Self Care | Payer: 59 | Source: Ambulatory Visit

## 2016-11-20 NOTE — Patient Instructions (Signed)
Your procedure is scheduled on:  Wednesday, Nov. 28, 2018  [facility] at:  11:00 AM  Pick up the phone at the desk and dial [phone].  Call this number if you have problems the morning of surgery: [phone].  Remember: Do NOT eat food:  After Midnight Tuesday  Do NOT drink clear liquids after:  6:30 AM Wednesday  Take these medicines the morning of surgery with a SIP OF WATER:  Hydroxychloroquine, Omperazole  Stop ALL herbal medications at this time  Do NOT smoke the day of surgery.  Do NOT wear jewelry (body piercing), metal hair clips/bobby pins, make-up, artifical eyelashes or nail polish. Do NOT wear lotions, powders, or perfumes.  You may wear deodorant. Do NOT shave for 48 hours prior to surgery. Do NOT bring valuables to the hospital. Contacts, dentures, or bridgework may not be worn into surgery.  Have a responsible adult drive you home and stay with you for 24 hours after your procedure  Bring a copy of your healthcare power of attorney and living will documents.  [place] [facility] - Preparing for Surgery Before surgery, you can play an important role.  Because skin is not sterile, your skin needs to be as free of germs as possible.  You can reduce the number of germs on your skin by washing with CHG (chlorahexidine gluconate) soap before surgery.  CHG is an antiseptic cleaner which kills germs and bonds with the skin to continue killing germs even after washing. Please DO NOT use if you have an allergy to CHG or antibacterial soaps.  If your skin becomes reddened/irritated stop using the CHG and inform your nurse when you arrive at Short Stay. Do not shave (including legs and underarms) for at least 48 hours prior to the first CHG shower.  You may shave your face/neck.  Please follow these instructions carefully:  1.  Shower with CHG Soap the night before surgery and the  morning of surgery.  2.  If you choose to wash your hair, wash  your hair first as usual with your normal  shampoo.  3.  After you shampoo, rinse your hair and body thoroughly to remove the shampoo.                             4.  Use CHG as you would any other liquid soap.  You can apply chg directly to the skin and wash.  Gently with a scrungie or clean washcloth.  5.  Apply the CHG Soap to your body ONLY FROM THE NECK DOWN.   Do   not use on face/ open                           Wound or open sores. Avoid contact with eyes, ears mouth and   genitals (private parts).                       Wash face,  Genitals (private parts) with your normal soap.             6.  Wash thoroughly, paying special attention to the area where your    surgery  will be performed.  7.  Thoroughly rinse your body with warm water from the neck down.  8.  DO NOT shower/wash with your normal soap after using and rinsing off the CHG Soap.  9.  Pat yourself dry with a clean towel.            10.  Wear clean pajamas.            11.  Place clean sheets on your bed the night of your first shower and do not  sleep with pets. Day of Surgery : Do not apply any lotions/deodorants the morning of surgery.  Please wear clean clothes to the hospital/surgery center.  FAILURE TO FOLLOW THESE INSTRUCTIONS MAY RESULT IN THE CANCELLATION OF YOUR SURGERY

## 2016-11-23 ENCOUNTER — Other Ambulatory Visit: Payer: Self-pay | Admitting: Obstetrics and Gynecology

## 2016-11-24 NOTE — Patient Instructions (Addendum)
Your procedure is scheduled on: Wednesday December 03, 2016 at 12:30 pm  Enter through the [facility] at: 11:00 am  Pick up the phone at the desk and dial [phone].  Call this number if you have problems the morning of surgery: [phone].  Remember: Do NOT eat food: after Midnight on Tuesday November 27 Do NOT drink clear liquids after: 6:30 am day of surgey Take these medicines the morning of surgery with a SIP OF WATER:  Plaquenil and prilosec.  DO NOT SMOKE DAY OF SURGERY  Do NOT wear jewelry (body piercing), metal hair clips/bobby pins, make-up, or nail polish. Do NOT wear lotions, powders, or perfumes.  You may wear deoderant. Do NOT shave for 48 hours prior to surgery. Do NOT bring valuables to the hospital. Contacts, dentures, or bridgework may not be worn into surgery.  Have a responsible adult drive you home and stay with you for 24 hours after your procedure

## 2016-11-26 ENCOUNTER — Encounter (HOSPITAL_COMMUNITY)
Admission: RE | Admit: 2016-11-26 | Discharge: 2016-11-26 | Disposition: A | Discharge status: Home / Self Care | Payer: 59 | Source: Ambulatory Visit | Attending: Obstetrics and Gynecology | Admitting: Obstetrics and Gynecology

## 2016-11-26 ENCOUNTER — Encounter (HOSPITAL_COMMUNITY): Payer: Self-pay

## 2016-11-26 ENCOUNTER — Other Ambulatory Visit: Payer: Self-pay

## 2016-11-26 DIAGNOSIS — I739 Peripheral vascular disease, unspecified: Secondary | ICD-10-CM | POA: Diagnosis not present

## 2016-11-26 DIAGNOSIS — K219 Gastro-esophageal reflux disease without esophagitis: Secondary | ICD-10-CM | POA: Insufficient documentation

## 2016-11-26 DIAGNOSIS — R51 Headache: Secondary | ICD-10-CM | POA: Insufficient documentation

## 2016-11-26 DIAGNOSIS — J45909 Unspecified asthma, uncomplicated: Secondary | ICD-10-CM | POA: Insufficient documentation

## 2016-11-26 DIAGNOSIS — Z01812 Encounter for preprocedural laboratory examination: Secondary | ICD-10-CM | POA: Diagnosis present

## 2016-11-26 HISTORY — DX: Headache: R51

## 2016-11-26 HISTORY — DX: Anemia, unspecified: D64.9

## 2016-11-26 HISTORY — DX: Pneumonia, unspecified organism: J18.9

## 2016-11-26 HISTORY — DX: Headache, unspecified: R51.9

## 2016-11-26 HISTORY — DX: Raynaud's syndrome without gangrene: I73.00

## 2016-11-26 HISTORY — DX: Gastro-esophageal reflux disease without esophagitis: K21.9

## 2016-11-26 NOTE — Pre-Procedure Instructions (Signed)
UNABLE TO GET LABS DONE DAY OF PRE OP APPOINTMENT. PRE OP NURSE TRIED 2 TIMES AND 2 DIFFERENT PEOPLE TRIED IN THE LAB AND WAS NOT ABLE TO GET BLOOD. IT WILL NEED TO BE DRAWN ON DAY OF SURGERY

## 2016-12-02 DIAGNOSIS — N939 Abnormal uterine and vaginal bleeding, unspecified: Secondary | ICD-10-CM | POA: Diagnosis not present

## 2016-12-02 DIAGNOSIS — J45909 Unspecified asthma, uncomplicated: Secondary | ICD-10-CM | POA: Diagnosis not present

## 2016-12-02 DIAGNOSIS — Z79899 Other long term (current) drug therapy: Secondary | ICD-10-CM | POA: Diagnosis not present

## 2016-12-02 DIAGNOSIS — N8302 Follicular cyst of left ovary: Secondary | ICD-10-CM | POA: Diagnosis not present

## 2016-12-02 DIAGNOSIS — L910 Hypertrophic scar: Secondary | ICD-10-CM | POA: Diagnosis not present

## 2016-12-02 DIAGNOSIS — I7300 Raynaud's syndrome without gangrene: Secondary | ICD-10-CM | POA: Diagnosis not present

## 2016-12-02 DIAGNOSIS — N838 Other noninflammatory disorders of ovary, fallopian tube and broad ligament: Secondary | ICD-10-CM | POA: Diagnosis present

## 2016-12-02 DIAGNOSIS — N840 Polyp of corpus uteri: Secondary | ICD-10-CM | POA: Diagnosis not present

## 2016-12-02 DIAGNOSIS — M329 Systemic lupus erythematosus, unspecified: Secondary | ICD-10-CM | POA: Diagnosis not present

## 2016-12-02 DIAGNOSIS — N736 Female pelvic peritoneal adhesions (postinfective): Secondary | ICD-10-CM | POA: Diagnosis not present

## 2016-12-02 DIAGNOSIS — R300 Dysuria: Secondary | ICD-10-CM | POA: Diagnosis not present

## 2016-12-02 DIAGNOSIS — G629 Polyneuropathy, unspecified: Secondary | ICD-10-CM | POA: Diagnosis not present

## 2016-12-02 DIAGNOSIS — K219 Gastro-esophageal reflux disease without esophagitis: Secondary | ICD-10-CM | POA: Diagnosis not present

## 2016-12-02 DIAGNOSIS — N882 Stricture and stenosis of cervix uteri: Secondary | ICD-10-CM | POA: Diagnosis not present

## 2016-12-02 DIAGNOSIS — Z7951 Long term (current) use of inhaled steroids: Secondary | ICD-10-CM | POA: Diagnosis not present

## 2016-12-02 LAB — BASIC METABOLIC PANEL
Anion gap: 6 (ref 5–15)
BUN: 11 mg/dL (ref 6–20)
CO2: 27 mmol/L (ref 22–32)
CREATININE: 0.62 mg/dL (ref 0.44–1.00)
Calcium: 8.8 mg/dL — ABNORMAL LOW (ref 8.9–10.3)
Chloride: 103 mmol/L (ref 101–111)
GFR calc Af Amer: >60 mL/min (ref >60)
Glucose, Bld: 88 mg/dL (ref 65–99)
POTASSIUM: 3.9 mmol/L (ref 3.5–5.1)
SODIUM: 136 mmol/L (ref 135–145)

## 2016-12-02 LAB — CBC
HCT: 40.2 % (ref 36.0–46.0)
Hemoglobin: 12.5 g/dL (ref 12.0–15.0)
MCH: 27.5 pg (ref 26.0–34.0)
MCHC: 31.1 g/dL (ref 30.0–36.0)
MCV: 88.5 fL (ref 78.0–100.0)
PLATELETS: 329 10*3/uL (ref 150–400)
RBC: 4.54 MIL/uL (ref 3.87–5.11)
RDW: 12.6 % (ref 11.5–15.5)
WBC: 6.6 10*3/uL (ref 4.0–10.5)

## 2016-12-02 LAB — TYPE AND SCREEN
ABO/RH(D): O POS
ANTIBODY SCREEN: NEGATIVE

## 2016-12-02 LAB — ABO/RH: ABO/RH(D): O POS

## 2016-12-03 ENCOUNTER — Ambulatory Visit (HOSPITAL_COMMUNITY): Payer: 59

## 2016-12-03 ENCOUNTER — Ambulatory Visit (HOSPITAL_COMMUNITY): Payer: 59 | Admitting: Anesthesiology

## 2016-12-03 ENCOUNTER — Encounter (HOSPITAL_COMMUNITY)
Admission: RE | Disposition: A | Discharge status: Home / Self Care | Payer: Self-pay | Source: Ambulatory Visit | Attending: Obstetrics and Gynecology

## 2016-12-03 ENCOUNTER — Encounter (HOSPITAL_COMMUNITY): Payer: Self-pay

## 2016-12-03 ENCOUNTER — Other Ambulatory Visit: Payer: Self-pay

## 2016-12-03 ENCOUNTER — Ambulatory Visit (HOSPITAL_COMMUNITY)
Admission: RE | Admit: 2016-12-03 | Discharge: 2016-12-03 | Disposition: A | Discharge status: Home / Self Care | Payer: 59 | Source: Ambulatory Visit | Attending: Obstetrics and Gynecology | Admitting: Obstetrics and Gynecology

## 2016-12-03 DIAGNOSIS — N882 Stricture and stenosis of cervix uteri: Secondary | ICD-10-CM | POA: Insufficient documentation

## 2016-12-03 DIAGNOSIS — M329 Systemic lupus erythematosus, unspecified: Secondary | ICD-10-CM | POA: Insufficient documentation

## 2016-12-03 DIAGNOSIS — I7300 Raynaud's syndrome without gangrene: Secondary | ICD-10-CM | POA: Insufficient documentation

## 2016-12-03 DIAGNOSIS — G629 Polyneuropathy, unspecified: Secondary | ICD-10-CM | POA: Insufficient documentation

## 2016-12-03 DIAGNOSIS — K219 Gastro-esophageal reflux disease without esophagitis: Secondary | ICD-10-CM | POA: Insufficient documentation

## 2016-12-03 DIAGNOSIS — Z79899 Other long term (current) drug therapy: Secondary | ICD-10-CM | POA: Insufficient documentation

## 2016-12-03 DIAGNOSIS — Z7951 Long term (current) use of inhaled steroids: Secondary | ICD-10-CM | POA: Insufficient documentation

## 2016-12-03 DIAGNOSIS — N840 Polyp of corpus uteri: Secondary | ICD-10-CM | POA: Insufficient documentation

## 2016-12-03 DIAGNOSIS — N939 Abnormal uterine and vaginal bleeding, unspecified: Secondary | ICD-10-CM | POA: Insufficient documentation

## 2016-12-03 DIAGNOSIS — N838 Other noninflammatory disorders of ovary, fallopian tube and broad ligament: Secondary | ICD-10-CM | POA: Insufficient documentation

## 2016-12-03 DIAGNOSIS — Z419 Encounter for procedure for purposes other than remedying health state, unspecified: Secondary | ICD-10-CM

## 2016-12-03 DIAGNOSIS — L910 Hypertrophic scar: Secondary | ICD-10-CM | POA: Insufficient documentation

## 2016-12-03 DIAGNOSIS — J45909 Unspecified asthma, uncomplicated: Secondary | ICD-10-CM | POA: Insufficient documentation

## 2016-12-03 DIAGNOSIS — N8302 Follicular cyst of left ovary: Secondary | ICD-10-CM | POA: Insufficient documentation

## 2016-12-03 DIAGNOSIS — N736 Female pelvic peritoneal adhesions (postinfective): Secondary | ICD-10-CM | POA: Insufficient documentation

## 2016-12-03 DIAGNOSIS — R300 Dysuria: Secondary | ICD-10-CM | POA: Insufficient documentation

## 2016-12-03 HISTORY — PX: LAPAROSCOPIC UNILATERAL SALPINGECTOMY: SHX5934

## 2016-12-03 HISTORY — PX: LAPAROSCOPIC SALPINGO OOPHERECTOMY: SHX5927

## 2016-12-03 HISTORY — PX: DILITATION & CURRETTAGE/HYSTROSCOPY WITH HYDROTHERMAL ABLATION: SHX5570

## 2016-12-03 SURGERY — DILATATION & CURETTAGE/HYSTEROSCOPY WITH HYDROTHERMAL ABLATION
Anesthesia: General | Laterality: Right

## 2016-12-03 MED ORDER — LIDOCAINE HCL (CARDIAC) 20 MG/ML IV SOLN
INTRAVENOUS | Status: AC
  Filled 2016-12-03: qty 5

## 2016-12-03 MED ORDER — LABETALOL HCL 5 MG/ML IV SOLN
INTRAVENOUS | Status: DC | PRN
  Administered 2016-12-03 (×4): 5 mg via INTRAVENOUS

## 2016-12-03 MED ORDER — PROPOFOL 10 MG/ML IV BOLUS
INTRAVENOUS | Status: DC | PRN
  Administered 2016-12-03: 150 mg via INTRAVENOUS

## 2016-12-03 MED ORDER — ONDANSETRON HCL 4 MG/2ML IJ SOLN: 4.0000 mg | Freq: Four times a day (QID) | INTRAMUSCULAR | Status: DC | PRN

## 2016-12-03 MED ORDER — LIDOCAINE HCL (CARDIAC) 20 MG/ML IV SOLN
INTRAVENOUS | Status: DC | PRN
  Administered 2016-12-03: 60 mg via INTRAVENOUS

## 2016-12-03 MED ORDER — ONDANSETRON HCL 4 MG/2ML IJ SOLN
INTRAMUSCULAR | Status: DC | PRN
  Administered 2016-12-03: 4 mg via INTRAVENOUS

## 2016-12-03 MED ORDER — OXYCODONE HCL 5 MG/5ML PO SOLN: 5.0000 mg | Freq: Once | ORAL | Status: DC | PRN

## 2016-12-03 MED ORDER — TRIAMCINOLONE ACETONIDE 40 MG/ML IJ SUSP
INTRAMUSCULAR | Status: DC | PRN
  Administered 2016-12-03: 40 mg via INTRAMUSCULAR

## 2016-12-03 MED ORDER — BUPIVACAINE HCL (PF) 0.25 % IJ SOLN
INTRAMUSCULAR | Status: AC
  Filled 2016-12-03: qty 30

## 2016-12-03 MED ORDER — TRAMADOL HCL 50 MG PO TABS: 50.0000 mg | ORAL_TABLET | Freq: Four times a day (QID) | ORAL | 0 refills | Status: AC | PRN

## 2016-12-03 MED ORDER — PROPOFOL 10 MG/ML IV BOLUS
INTRAVENOUS | Status: AC
  Filled 2016-12-03: qty 20

## 2016-12-03 MED ORDER — SUGAMMADEX SODIUM 200 MG/2ML IV SOLN
INTRAVENOUS | Status: DC | PRN
  Administered 2016-12-03: 200 mg via INTRAVENOUS

## 2016-12-03 MED ORDER — ROCURONIUM BROMIDE 100 MG/10ML IV SOLN
INTRAVENOUS | Status: DC | PRN
  Administered 2016-12-03: 40 mg via INTRAVENOUS
  Administered 2016-12-03: 10 mg via INTRAVENOUS

## 2016-12-03 MED ORDER — DEXAMETHASONE SODIUM PHOSPHATE 10 MG/ML IJ SOLN
INTRAMUSCULAR | Status: DC | PRN
  Administered 2016-12-03: 10 mg via INTRAVENOUS

## 2016-12-03 MED ORDER — ONDANSETRON HCL 4 MG/2ML IJ SOLN
INTRAMUSCULAR | Status: AC
  Filled 2016-12-03: qty 2

## 2016-12-03 MED ORDER — LACTATED RINGERS IV SOLN
INTRAVENOUS | Status: DC
  Administered 2016-12-03: 125 mL/h via INTRAVENOUS
  Administered 2016-12-03: 14:00:00 via INTRAVENOUS

## 2016-12-03 MED ORDER — FENTANYL CITRATE (PF) 100 MCG/2ML IJ SOLN: 25.0000 ug | INTRAMUSCULAR | Status: DC | PRN

## 2016-12-03 MED ORDER — MIDAZOLAM HCL 2 MG/2ML IJ SOLN
INTRAMUSCULAR | Status: AC
  Filled 2016-12-03: qty 2

## 2016-12-03 MED ORDER — PROMETHAZINE HCL 25 MG/ML IJ SOLN
6.2500 mg | INTRAMUSCULAR | Status: DC | PRN
  Administered 2016-12-03: 6.25 mg via INTRAVENOUS

## 2016-12-03 MED ORDER — DEXAMETHASONE SODIUM PHOSPHATE 10 MG/ML IJ SOLN
INTRAMUSCULAR | Status: AC
  Filled 2016-12-03: qty 1

## 2016-12-03 MED ORDER — GLYCOPYRROLATE 0.2 MG/ML IJ SOLN
INTRAMUSCULAR | Status: AC
  Filled 2016-12-03: qty 4

## 2016-12-03 MED ORDER — LIDOCAINE HCL 2 % IJ SOLN
INTRAMUSCULAR | Status: AC
  Filled 2016-12-03: qty 20

## 2016-12-03 MED ORDER — SUGAMMADEX SODIUM 200 MG/2ML IV SOLN
INTRAVENOUS | Status: AC
  Filled 2016-12-03: qty 2

## 2016-12-03 MED ORDER — LABETALOL HCL 5 MG/ML IV SOLN
INTRAVENOUS | Status: AC
  Filled 2016-12-03: qty 4

## 2016-12-03 MED ORDER — TRIAMCINOLONE ACETONIDE 40 MG/ML IJ SUSP
40.0000 mg | Freq: Once | INTRAMUSCULAR | Status: DC
  Filled 2016-12-03: qty 1

## 2016-12-03 MED ORDER — BUPIVACAINE HCL (PF) 0.25 % IJ SOLN
INTRAMUSCULAR | Status: DC | PRN
Start: 2016-12-03 — End: 2016-12-03
  Administered 2016-12-03: 28 mL

## 2016-12-03 MED ORDER — METRONIDAZOLE IN NACL 5-0.79 MG/ML-% IV SOLN
500.0000 mg | INTRAVENOUS | Status: AC
  Administered 2016-12-03: 500 mg via INTRAVENOUS
  Filled 2016-12-03: qty 100

## 2016-12-03 MED ORDER — NEOSTIGMINE METHYLSULFATE 10 MG/10ML IV SOLN
INTRAVENOUS | Status: AC
  Filled 2016-12-03: qty 1

## 2016-12-03 MED ORDER — PROMETHAZINE HCL 25 MG/ML IJ SOLN
INTRAMUSCULAR | Status: AC
  Filled 2016-12-03: qty 1

## 2016-12-03 MED ORDER — CIPROFLOXACIN IN D5W 400 MG/200ML IV SOLN
400.0000 mg | INTRAVENOUS | Status: AC
  Administered 2016-12-03: 400 mg via INTRAVENOUS
  Filled 2016-12-03: qty 200

## 2016-12-03 MED ORDER — OXYCODONE HCL 5 MG PO TABS: 5.0000 mg | ORAL_TABLET | Freq: Once | ORAL | Status: DC | PRN

## 2016-12-03 MED ORDER — FENTANYL CITRATE (PF) 250 MCG/5ML IJ SOLN
INTRAMUSCULAR | Status: AC
  Filled 2016-12-03: qty 5

## 2016-12-03 MED ORDER — FENTANYL CITRATE (PF) 250 MCG/5ML IJ SOLN
INTRAMUSCULAR | Status: DC | PRN
  Administered 2016-12-03: 100 ug via INTRAVENOUS
  Administered 2016-12-03 (×3): 50 ug via INTRAVENOUS
  Administered 2016-12-03 (×2): 100 ug via INTRAVENOUS
  Administered 2016-12-03: 50 ug via INTRAVENOUS

## 2016-12-03 MED ORDER — SCOPOLAMINE 1 MG/3DAYS TD PT72
MEDICATED_PATCH | TRANSDERMAL | Status: AC
  Filled 2016-12-03: qty 1

## 2016-12-03 MED ORDER — SCOPOLAMINE 1 MG/3DAYS TD PT72
1.0000 | MEDICATED_PATCH | Freq: Once | TRANSDERMAL | Status: DC
  Administered 2016-12-03: 1.5 mg via TRANSDERMAL

## 2016-12-03 MED ORDER — MIDAZOLAM HCL 5 MG/5ML IJ SOLN
INTRAMUSCULAR | Status: DC | PRN
  Administered 2016-12-03: 2 mg via INTRAVENOUS

## 2016-12-03 SURGICAL SUPPLY — 36 items
CANISTER SUCT 3000ML PPV (MISCELLANEOUS) ×5 IMPLANT
CATH ROBINSON RED A/P 16FR (CATHETERS) ×5 IMPLANT
CATH ROBINSON RED A/P 8FR (CATHETERS) ×5 IMPLANT
CHLORAPREP W/TINT 26ML (MISCELLANEOUS) ×5 IMPLANT
CONTAINER PREFILL 10% NBF 60ML (FORM) ×10 IMPLANT
DERMABOND ADHESIVE PROPEN (GAUZE/BANDAGES/DRESSINGS) ×2
DERMABOND ADVANCED (GAUZE/BANDAGES/DRESSINGS) ×2
DERMABOND ADVANCED .7 DNX12 (GAUZE/BANDAGES/DRESSINGS) ×3 IMPLANT
DERMABOND ADVANCED .7 DNX6 (GAUZE/BANDAGES/DRESSINGS) ×3 IMPLANT
DILATOR CANAL MILEX (MISCELLANEOUS) ×5 IMPLANT
DRSG OPSITE POSTOP 3X4 (GAUZE/BANDAGES/DRESSINGS) ×5 IMPLANT
DURAPREP 26ML APPLICATOR (WOUND CARE) IMPLANT
GLOVE BIO SURGEON STRL SZ7 (GLOVE) ×5 IMPLANT
GLOVE BIOGEL PI IND STRL 7.0 (GLOVE) ×12 IMPLANT
GLOVE BIOGEL PI INDICATOR 7.0 (GLOVE) ×8
GOWN STRL REUS W/TWL LRG LVL3 (GOWN DISPOSABLE) ×10 IMPLANT
PACK LAPAROSCOPY BASIN (CUSTOM PROCEDURE TRAY) ×5 IMPLANT
PACK TRENDGUARD 450 HYBRID PRO (MISCELLANEOUS) ×3 IMPLANT
PACK TRENDGUARD 600 HYBRD PROC (MISCELLANEOUS) IMPLANT
PACK VAGINAL MINOR WOMEN LF (CUSTOM PROCEDURE TRAY) ×5 IMPLANT
PAD OB MATERNITY 4.3X12.25 (PERSONAL CARE ITEMS) ×5 IMPLANT
POUCH SPECIMEN RETRIEVAL 10MM (ENDOMECHANICALS) ×5 IMPLANT
PROTECTOR NERVE ULNAR (MISCELLANEOUS) ×10 IMPLANT
SET GENESYS HTA PROCERVA (MISCELLANEOUS) ×5 IMPLANT
SET IRRIG TUBING LAPAROSCOPIC (IRRIGATION / IRRIGATOR) IMPLANT
SHEARS HARMONIC ACE PLUS 36CM (ENDOMECHANICALS) ×5 IMPLANT
SLEEVE XCEL OPT CAN 5 100 (ENDOMECHANICALS) ×5 IMPLANT
SUT MNCRL AB 4-0 PS2 18 (SUTURE) ×5 IMPLANT
SUT VICRYL 0 UR6 27IN ABS (SUTURE) ×5 IMPLANT
TOWEL OR 17X24 6PK STRL BLUE (TOWEL DISPOSABLE) ×10 IMPLANT
TRAY FOLEY CATH SILVER 14FR (SET/KITS/TRAYS/PACK) ×5 IMPLANT
TRENDGUARD 450 HYBRID PRO PACK (MISCELLANEOUS) ×5
TRENDGUARD 600 HYBRID PROC PK (MISCELLANEOUS)
TROCAR XCEL NON-BLD 11X100MML (ENDOMECHANICALS) ×5 IMPLANT
TROCAR XCEL NON-BLD 5MMX100MML (ENDOMECHANICALS) ×5 IMPLANT
WARMER LAPAROSCOPE (MISCELLANEOUS) ×5 IMPLANT

## 2016-12-03 NOTE — Anesthesia Procedure Notes (Signed)
Procedure Name: Intubation Date/Time: 12/03/2016 12:45 PM Performed by: Renford DillsMullins, Jakye Mullens L, CRNA Pre-anesthesia Checklist: Patient identified, Patient being monitored, Timeout performed, Emergency Drugs available and Suction available Patient Re-evaluated:Patient Re-evaluated prior to induction Oxygen Delivery Method: Circle System Utilized Preoxygenation: Pre-oxygenation with 100% oxygen Induction Type: IV induction Ventilation: Mask ventilation without difficulty Laryngoscope Size: Miller and 2 Grade View: Grade II Tube type: Oral Tube size: 7.0 mm Number of attempts: 1 Airway Equipment and Method: stylet Placement Confirmation: ETT inserted through vocal cords under direct vision,  positive ETCO2 and breath sounds checked- equal and bilateral Secured at: 20 cm Tube secured with: Tape Dental Injury: Teeth and Oropharynx as per pre-operative assessment

## 2016-12-03 NOTE — Discharge Instructions (Addendum)
°Post Anesthesia Home Care Instructions ° °Activity: °Get plenty of rest for the remainder of the day. A responsible individual must stay with you for 24 hours following the procedure.  °For the next 24 hours, DO NOT: °-Drive a car °-Operate machinery °-Drink alcoholic beverages °-Take any medication unless instructed by your physician °-Make any legal decisions or sign important papers. ° °Meals: °Start with liquid foods such as gelatin or soup. Progress to regular foods as tolerated. Avoid greasy, spicy, heavy foods. If nausea and/or vomiting occur, drink only clear liquids until the nausea and/or vomiting subsides. Call your physician if vomiting continues. ° °Special Instructions/Symptoms: °Your throat may feel dry or sore from the anesthesia or the breathing tube placed in your throat during surgery. If this causes discomfort, gargle with warm salt water. The discomfort should disappear within 24 hours. ° °If you had a scopolamine patch placed behind your ear for the management of post- operative nausea and/or vomiting: ° °1. The medication in the patch is effective for 72 hours, after which it should be removed.  Wrap patch in a tissue and discard in the trash. Wash hands thoroughly with soap and water. °2. You may remove the patch earlier than 72 hours if you experience unpleasant side effects which may include dry mouth, dizziness or visual disturbances. °3. Avoid touching the patch. Wash your hands with soap and water after contact with the patch. °  ° ° ° °Diagnostic Laparoscopy, Care After °Refer to this sheet in the next few weeks. These instructions provide you with information about caring for yourself after your procedure. Your health care provider may also give you more specific instructions. Your treatment has been planned according to current medical practices, but problems sometimes occur. Call your health care provider if you have any problems or questions after your procedure. °What can I  expect after the procedure? °After your procedure, it is common to have mild discomfort in the throat and abdomen. °Follow these instructions at home: °· Take over-the-counter and prescription medicines only as told by your health care provider. °· Do not drive for 24 hours if you received a sedative. °· Return to your normal activities as told by your health care provider. °· Do not take baths, swim, or use a hot tub until your health care provider approves. You may shower. °· Follow instructions from your health care provider about how to take care of your incision. Make sure you: °? Wash your hands with soap and water before you change your bandage (dressing). If soap and water are not available, use hand sanitizer. °? Change your dressing as told by your health care provider. °? Leave stitches (sutures), skin glue, or adhesive strips in place. These skin closures may need to stay in place for 2 weeks or longer. If adhesive strip edges start to loosen and curl up, you may trim the loose edges. Do not remove adhesive strips completely unless your health care provider tells you to do that. °· Check your incision area every day for signs of infection. Check for: °? More redness, swelling, or pain. °? More fluid or blood. °? Warmth. °? Pus or a bad smell. °· It is your responsibility to get the results of your procedure. Ask your health care provider or the department performing the procedure when your results will be ready. °Contact a health care provider if: °· There is new pain in your shoulders. °· You feel light-headed or faint. °· You are unable to pass gas or   unable to have a bowel movement.  You feel nauseous or you vomit.  You develop a rash.  You have more redness, swelling, or pain around your incision.  You have more fluid or blood coming from your incision.  Your incision feels warm to the touch.  You have pus or a bad smell coming from your incision.  You have a fever or chills. Get help  right away if:  Your pain is getting worse.  You have ongoing vomiting.  The edges of your incision open up.  You have trouble breathing.  You have chest pain. .Marland Kitchen

## 2016-12-03 NOTE — Transfer of Care (Signed)
Immediate Anesthesia Transfer of Care Note  Patient: Sheri Reed  Procedure(s) Performed: DILATATION & CURETTAGE/HYSTEROSCOPY WITH HYDROTHERMAL ABLATION (N/A ) LAPAROSCOPIC RIGHT SALPINGECTOMY (Right ) LAPAROSCOPIC LEFT SALPINGO OOPHORECTOMY (Left )  Patient Location: PACU  Anesthesia Type:General  Level of Consciousness: sedated  Airway & Oxygen Therapy: Patient Spontanous Breathing and Patient connected to nasal cannula oxygen  Post-op Assessment: Report given to RN and Post -op Vital signs reviewed and stable  Post vital signs: stable  Last Vitals:  Vitals:   12/03/16 1118  BP: (!) 148/94  Pulse: 85  Resp: 16  Temp: 37.2 C  SpO2: 97%    Last Pain:  Vitals:   12/03/16 1118  TempSrc: Oral      Patients Stated Pain Goal: 3 (12/03/16 1118)  Complications: No apparent anesthesia complications

## 2016-12-03 NOTE — Brief Op Note (Signed)
12/03/2016  6:39 PM  PATIENT:  Sheri Reed  51 y.o. female  PRE-OPERATIVE DIAGNOSIS:  N93.9 AUB, Left ovarian mass, Cervical stenosis, Keloid  POST-OPERATIVE DIAGNOSIS:  Same, left adnexal adhesions  PROCEDURE:  Procedure(s) with comments: DILATATION & CURETTAGE/HYSTEROSCOPY WITH HYDROTHERMAL ABLATION (N/A) - *Under Ultrasound Guidance* 3824fr 8mm MAX for HTA LAPAROSCOPIC RIGHT SALPINGECTOMY (Right) LAPAROSCOPIC LEFT SALPINGO OOPHORECTOMY (Left)  SURGEON:  Surgeon(s) and Role:    Geryl Rankins* Alona Danford, MD - Primary  PHYSICIAN ASSISTANT: None  ASSISTANTS: Technician   ANESTHESIA:   local and general  EBL:  25 mL   BLOOD ADMINISTERED:none  DRAINS: 8 French Red Rubber Catheter  LOCAL MEDICATIONS USED:  MARCAINE   , LIDOCAINE  and OTHER Kenalog 40 mg  SPECIMEN:  Source of Specimen:  Left ovary and tube, right fallopian tube, endometrial currettings, pelvic washings  DISPOSITION OF SPECIMEN:  PATHOLOGY  COUNTS:  YES  TOURNIQUET:  * No tourniquets in log *  DICTATION: .Other Dictation: Dictation Number [id number]  PLAN OF CARE: Discharge to home after PACU  PATIENT DISPOSITION:  PACU - hemodynamically stable.   Delay start of Pharmacological VTE agent (>24hrs) due to surgical blood loss or risk of bleeding: yes

## 2016-12-03 NOTE — Anesthesia Preprocedure Evaluation (Signed)
Anesthesia Evaluation  Patient identified by MRN, date of birth, ID band Patient awake    Reviewed: Allergy & Precautions, H&P , NPO status , Patient's Chart, lab work & pertinent test results  Airway Mallampati: II   Neck ROM: full    Dental   Pulmonary asthma ,    breath sounds clear to auscultation       Cardiovascular + Peripheral Vascular Disease   Rhythm:regular Rate:Normal     Neuro/Psych  Headaches,    GI/Hepatic GERD  ,  Endo/Other  obese  Renal/GU      Musculoskeletal   Abdominal   Peds  Hematology   Anesthesia Other Findings   Reproductive/Obstetrics                             Anesthesia Physical Anesthesia Plan  ASA: II  Anesthesia Plan: General   Post-op Pain Management:    Induction: Intravenous  PONV Risk Score and Plan: 3 and Ondansetron, Dexamethasone, Midazolam, Scopolamine patch - Pre-op and Treatment may vary due to age or medical condition  Airway Management Planned: LMA  Additional Equipment:   Intra-op Plan:   Post-operative Plan:   Informed Consent: I have reviewed the patients History and Physical, chart, labs and discussed the procedure including the risks, benefits and alternatives for the proposed anesthesia with the patient or authorized representative who has indicated his/her understanding and acceptance.     Plan Discussed with: CRNA, Anesthesiologist and Surgeon  Anesthesia Plan Comments:         Anesthesia Quick Evaluation

## 2016-12-03 NOTE — Interval H&P Note (Signed)
History and Physical Interval Note:  12/03/2016 12:21 PM  Sheri Reed  has presented today for surgery, with the diagnosis of N93.9 AUB  The various methods of treatment have been discussed with the patient and family. After consideration of risks, benefits and other options for treatment, the patient has consented to  Procedure(s) with comments: DILATATION & CURETTAGE/HYSTEROSCOPY WITH HYDROTHERMAL ABLATION (N/A) - *Under Ultrasound Guidance* 6024fr 8mm MAX for HTA LAPAROSCOPIC BILATERAL SALPINGECTOMY (N/A) - removal of left ovary. as a surgical intervention .  The patient's history has been reviewed, patient examined, no change in status, stable for surgery.  I have reviewed the patient's chart and labs.  Questions were answered to the patient's satisfaction.     Geryl RankinsEvelyn Kalid Ghan

## 2016-12-03 NOTE — H&P (Signed)
History of Present Illness  General:          Pt presents for preop for Left ovarian mass and AUB.        Pt has a small persistant mass with calcifications on the left ovary. Desires removal. Pt has a long h/o perimenopausal bleeding. Previous endometrial sampling has been normal.        Pt s/p a laparoscopy cholecystectomy a few months ago.    Current Medications  TakingVitamin B-12 100 MCG Tablet 1 tablet Orally Once a day    Plaquenil(Hydroxychloroquine Sulfate) 200 MG Tablet 2 tablets with food or milk Orally Once a day, Notes: per Dr. Nickola Major    Vitamin D 1000 UNIT Tablet 1 tablet Orally Once a day    Flaxseed Oil 1000 MG Capsule Orally    Colace(Docusate Sodium) 100 MG Capsule 1 capsule as needed Orally Once a day    ZyrTEC(Cetirizine HCl) 10 MG Tablet 1 tablet Orally Once a day    Calcium 1 tab Oral    Omeprazole 40 MG Unspecified 1 capsule Orally Once a day    Nu-Iron(Polysaccharide Iron Complex) 150 MG Capsule 1 capsule Orally Once a day    Not-TakingGabapentin 300 MG Capsule 1 capsule Orally prn, Notes: as needed per Dr. Nickola Major    Valtrex(ValACYclovir HCl) 500 MG Tablet 1 tablet Orally Once a day, Notes: prn    Advair Diskus(Fluticasone-Salmeterol) 100-50 MCG/DOSE Aerosol Powder Breath Activated 1 puff Inhalation Twice a day, Notes: prn    Ibuprofen 800 MG Tablet 1 tablet Orally qd prn    Medication List reviewed and reconciled with the patient        Past Medical History       Asthma.        Menstrual Migraines.        HSV 2.        GERD.        Iron Def Anemia.        Raynauds, peripheral neuropathy- dx in 2012 Nickola Major).        Systemic Lupus-- dx in 2009 Nickola Major) .            Surgical History  ganglion cyst - (R) x2 ;(L) once 1976-77  fibroid tumors removed 2000  colonoscopy (due 10 yrs0 2016  gall bladder 2018      Family History  Father: alive 40 yrs, gout, osteoarthritis, diagnosed with Diabetes, Hypertension  Mother: alive 36 yrs,  osteoarthritis, Breast cancer, legally blind, dementia  Paternal Grand Father: deceased  Paternal Grand Mother: deceased  Maternal Grand Father: deceased  Maternal Grand Mother: deceased  Sister 1: alive 54 yrs, healthy  Sister 2: alive 46 yrs, diagnosed with Diabetes  Sister 3: alive 29 yrs, diagnosed with Diabetes  3 sister(s) .   Denies family hx colon cancer, colon polyps or liver disease.      Social History  General:         Tobacco use               cigarettes:  Never smoked             Tobacco history last updated  11/17/2016       no EXPOSURE TO PASSIVE SMOKE.        no Alcohol.        no Caffeine.        no Recreational drug use.        Exercise: walking 30 min x 4 days a week (averages 03-4998 a  day).        DENTAL CARE: good.        Marital Status: single.        Children: none.        OCCUPATION: [employer].        COMMUNICATION BARRIERS: none.  Lives alone.    Gyn History  Sexual activity currently sexually active.   Periods : irregular.   LMP 5/30, 7/12, 7/27, 09/03/16.   Denies H/O Birth control.   Last pap smear date 05/18/2015 negative HPV.   Last mammogram date 09/12/16.   Denies H/O Abnormal pap smear.   STD HSV.   GYN procedures 05/24/12, Endometrial Biopsy, benign, 05/2014 benign endometrial bx.      OB History  Never been pregnant  per patient.      Allergies  Penicillin (for allergy): rash: Allergy  Codeine (for allergy): "stomach issues": Allergy  Aspirin: throat swelling: Allergy  Sulfa drugs (for allergy): rash: Allergy  Betadine Surgical Scrub: rash/swell: Allergy      Hospitalization/Major Diagnostic Procedure  gallbladder 2018      Review of Systems  Denies fever/chills, chest pain, SOB, headaches, numbness/tingling. No h/o complication with anesthesia, bleeding disorders or blood clots.    Vital Signs  Wt 189, Wt change 9 lb, Ht 61, BMI 35.71, Pulse sitting 81, BP sitting 136/88.    Physical Examination  GENERAL:           Patient appears  alert and oriented.          General Appearance:  well-appearing, well-developed, no acute distress.          Speech:  clear.   LUNGS:          Auscultation:  no wheezing/rhonchi/rales. CTA bilaterally.   HEART:          Heart sounds:  normal. RRR. no murmur.   ABDOMEN:          General:  soft nontender, nondistended, no masses.   FEMALE GENITOURINARY:          Pelvic  Not examined.   EXTREMITIES:          General:  No edema or calf tenderness.           Assessments  1. Pre-operative clearance - Z01.818 (Primary)  2. Dysuria - R30.0  3. 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 Mass of left ovary - N83.9    Treatment  1. Pre-operative clearance   Notes: Pt counseled on R/B/A of procedure. Pt aware that removal of left ovarian mass may result in hot flashes. Pt desires to proceed instead of survillence. Pt also informed of risk of uterine perforation. Will perform under ultrasound guidance. Endometrial ablation will be done due to persistant spotting. Pt counseled that this procedure may delay diagnosis of cancer. Pt desires to proceed.      2. Dysuria        LAB: Culture if Positive (Urinalysis)      LAB: Urine Dip w/reflex to micro if positive   3. Cervical stenosis (uterine cervix)   Start Cytotec Tablet, 200 MCG, 1 tablet, vaginally, qhs night before procedure and am of procedure, 1 days, 2, Refills 0          Labs        Lab: Urine Dip w/reflex to micro if positive       Specific Gravity 1.010  1.010-1.030 -        pH 7.0  5.0-8.0 -  Leukocyte Esterase NEGATIVE  Negative -        Blood NEGATIVE  Negative - ERY/UL       Glucose NEGATIVE  Negative - g/dL       Nitrite NEGATIVE  Negative -        Protein NEGATIVE  Negative - mg/dL       Ketones NEGATIVE  Negative - mg/dL       Urine Bilirubin NEGATIVE  Negative -        Urobilinogen 0.2  0.0-1.0 - mg/dL                Vihan Santagata B 11/20/2016 10:55:46 AM > Neg UTI. Chapman,Courtney  11/20/2016 11:08:37 AM > pt made aware of results by phone - pt stated that she is still having itching and burning when she urinates Jill Stopka B 11/23/2016 10:09:15 PM > Send in Diflucan 150 mg po once. Repeat in 3 days prn. #2 Ref: 1 Bowden,Shantine 11/24/2016 01:18:05 PM > rx sent in-pt aware                Lab: Culture if Positive (Urinalysis)       CULTURE IF POS Culture not indicated  -                 Lynford Espinoza B 11/20/2016 10:55:46 AM > Neg UTI. Chapman,Courtney 11/20/2016 11:08:37 AM > pt made aware of results by phone - pt stated that she is still having itching and burning when she urinates Shelba Susi B 11/23/2016 10:09:15 PM > Send in Diflucan 150 mg po once. Repeat in 3 days prn. #2 Ref: 1 Bowden,Shantine 11/24/2016 01:18:05 PM > rx sent in-pt aware          Follow Up  2 Weeks post op

## 2016-12-04 ENCOUNTER — Encounter (HOSPITAL_COMMUNITY): Payer: Self-pay | Admitting: Obstetrics and Gynecology

## 2016-12-04 NOTE — Op Note (Signed)
[name]:  ,                            ACCOUNT NO.:  [id number]  MEDICAL RECORD NO.:  [id number]  LOCATION:                                 FACILITY:  PHYSICIAN:  Pieter PartridgeEvelyn B Deira Shimer, MD        DATE OF BIRTH:  DATE OF PROCEDURE:  12/03/2016 DATE OF DISCHARGE:                              OPERATIVE REPORT   PREOPERATIVE DIAGNOSIS:  Abnormal uterine bleeding, left ovarian mass and keloid.  POSTOPERATIVE DIAGNOSIS:  Abnormal uterine bleeding, left ovarian mass, keloid, and left adnexal adhesions.  PROCEDURES:  Laparoscopic left salpingo-oophorectomy, right salpingectomy, and hysteroscopy D and C with HCA endometrial ablation.  ASSISTANT:  [employer] consultantTechnician.  ANESTHESIA:  Local and general.  EBL:  25.  BLOOD ADMINISTERED:  None.  DRAINS:  An 8-French red rubber catheter.  LOCAL:  Marcaine, lidocaine, and Kenalog.  SPECIMENS:  Left ovary with tube, right fallopian tube, endometrial curetting, and pelvic washings.  DISPOSITION OF SPECIMENS:  To Pathology.  PATIENT DISPOSITION:  To PACU, hemodynamically stable.  COMPLICATIONS:  None.  FINDINGS:  Normal uterus.  Adhesions of ovary to the posterior side of the uterus.  The fallopian tube was also adhesed to the ovary with multiple hydatid cysts, ovarian cyst on the right ovary.  Small exterior calcification noted.  Right ovary and tube were normal.  The endometrial cavity appeared with fluffy myometrium.  No obvious masses.  Stenotic cervix.  DESCRIPTION OF PROCEDURE:  The patient was identified in the holding area.  Abdomen was marked and consent was signed.  The patient was instructed to take Cytotec prior to surgery due to history of cervical stenosis.  She was then taken to the operating room.  She was placed in the dorsal lithotomy position.  She underwent general anesthesia without complication.  She was then prepped and draped in a normal sterile fashion.  A 14-French catheter red rubber was attempted to be inserted, but the  urethral meatus was too small or would not allow it to enter easily, so an 8-French red rubber catheter was placed.  It slowly drained.  We began draping.  The red rubber catheter was placed after I placed the uterine manipulator.  The Graves speculum was inserted into the vagina.  The cervix was identified and grasped with a single-tooth tenaculum.  The cervix was stenotic.  The os binder, which was pliable with plastic, was then advanced and the tip was bent.  I did not have ultrasound at that time, so I decided to use the acorn.  The acorn was advanced through the uterus to the cervix and the single-tooth tenaculum was attached.  Then, I placed a red rubber catheter and allowed the bladder to drain.  Because it was draining so slow, we just left that in to drain into the bag during the laparoscopic portion of the procedure.  Attention was then turned to the abdomen.  She has had a previous cholecystectomy and her umbilical incision was below the umbilicus, so I went in the upper fold that was marked and Marcaine was injected and a 5- mm skin incision was then made and the  camera and the 5-mm trocar were advanced under direct visualization.  Once intraabdominal access was confirmed, the CO2 gas was used to insufflate the abdomen.  The second ports were placed, a 5 mm on the right lower quadrant and a 10 on the left lower quadrant.  That was also done under direct visualization and premedication with Marcaine.  Exploration of an internal cavity was noted.  In the right upper quadrant, adhesions were noted consistent with previous surgery.  The atraumatic grasper was used to grasp the fallopian tube and the fallopian tube was then transected.  There was bowel scarred in the left lower quadrant and multiple cysts, so freeing up the fallopian tube helped to kind of identify the planes and we were at the ovarian plane. I attempted to identify ureters prior to removal.  The scarring was  so significant that was not possible.  However, I did stay high.  So, once the fallopian tube was transected then I was able to see the ovary underneath and slowly with maximum hemostasis cleared that off the pelvic sidewall and posterior to the uterus at the end, it was very fibrous and firm at the back of the uterus.  I believe I got all of the tissue, but it was difficult to see.  I did inspect the ovary afterwards to see what remained.  We were successful in getting the ovary off the uterus.  There was no bleeding on the uterus and the pedicles or the sidewall appeared normal and no bleeding.  Attention was turned to the right side and the fallopian tube was grasped and removed with the Harmonic scalpel without issue.  Pelvic washings were performed and the blood was removed from the cul-de-sac.  The bag was then inserted through the 10-mm specimen.  The specimen was then replaced in the bag and brought up to the abdomen.  I extended the incision with the Mayo scissors and the skin was also extended.  The patient had significant adipose and it was difficult to see the fascia and remove it.  But that was done successfully without any trauma.  Once the specimen was removed, I then closed the fascia with 0 Vicryl.  Again, there was a lot of adipose, it was very deep.  We used [employer] and appendiceal retractors.  Once that was closed, palpated, and there was no defect.  I had previously put in Arista on the pedicle site.  Once the fascia was closed, all trocars were removed under direct visualization.  4-0 Monocryl was used to reapproximate the subcutaneous space and Dermabond was applied.  Attention was turned to the pelvis.  The incisions were draped prior to the ultrasonographer placing the transducer on the abdomen.  The single- tooth tenaculum was removed.  Graves speculum was inserted.  The single- tooth tenaculum was then replaced and under ultrasound guidance intrauterine  access was confirmed.  The uterus was dilated up to a 6 and 7.  The hysteroscope/HCA device was advanced through the cervix and into the uterine fundus.  It was very shaggy and somewhat difficult to identify, but there was no false passage.  It was clearly seen on the ultrasound that we were in the endometrial cavity and with adequate distention that was obvious by inspection.  The HCA device was then activated.  The ablation proceeded and was completed without alarm.  There was a very adequate burn at the end of the procedure.  The HCA hysteroscope was removed after it was cooled down.  It was then removed from the uterus.  The curette was then used to do the curettage and the curettings were sent to Pathology.  I had not injected lidocaine prior to the procedure, so I used the remainder of the lidocaine.  Silver nitrate was used for hemostasis on the cervix.  Also, the patient has a history of keloiding.  Kenalog mixed with Marcaine was injected at the incisions.  There was Dermabond already on there.  The umbilical port opened up a little bit, so the honeycomb dressing was placed over that.  All instrument, sponge, and needle counts were correct x3.  The patient received Cipro and Flagyl prior to the procedure.  SCDs were on and operating throughout the entire case.  She tolerated the procedure well and was taken to the recovery room in stable condition.     Pieter Partridge, MD     EBV/MEDQ  D:  12/03/2016  T:  12/04/2016  Job:  [id number]

## 2016-12-04 NOTE — Anesthesia Postprocedure Evaluation (Signed)
Anesthesia Post Note  Patient: Sheri Reed  Procedure(s) Performed: DILATATION & CURETTAGE/HYSTEROSCOPY WITH HYDROTHERMAL ABLATION (N/A ) LAPAROSCOPIC RIGHT SALPINGECTOMY (Right ) LAPAROSCOPIC LEFT SALPINGO OOPHORECTOMY (Left )     Patient location during evaluation: PACU Anesthesia Type: General Level of consciousness: awake and alert Pain management: pain level controlled Vital Signs Assessment: post-procedure vital signs reviewed and stable Respiratory status: spontaneous breathing, nonlabored ventilation, respiratory function stable and patient connected to nasal cannula oxygen Cardiovascular status: blood pressure returned to baseline and stable Postop Assessment: no apparent nausea or vomiting Anesthetic complications: no    Last Vitals:  Vitals:   12/03/16 1600 12/03/16 1743  BP: (!) 138/96 134/77  Pulse: 80 86  Resp: 13 16  Temp:  36.7 C  SpO2: 96% 100%    Last Pain:  Vitals:   12/03/16 1118  TempSrc: Oral                 Sydnee Lamour S

## 2017-10-01 ENCOUNTER — Other Ambulatory Visit: Payer: Self-pay | Admitting: Obstetrics and Gynecology

## 2017-10-01 DIAGNOSIS — Z1231 Encounter for screening mammogram for malignant neoplasm of breast: Secondary | ICD-10-CM

## 2017-10-27 ENCOUNTER — Ambulatory Visit: Payer: 59

## 2017-12-01 ENCOUNTER — Ambulatory Visit
Admission: RE | Admit: 2017-12-01 | Discharge: 2017-12-01 | Disposition: A | Discharge status: Home / Self Care | Payer: 59 | Source: Ambulatory Visit | Attending: Obstetrics and Gynecology | Admitting: Obstetrics and Gynecology

## 2017-12-01 ENCOUNTER — Encounter: Payer: Self-pay | Admitting: Radiology

## 2017-12-01 DIAGNOSIS — Z1231 Encounter for screening mammogram for malignant neoplasm of breast: Secondary | ICD-10-CM

## 2018-06-10 ENCOUNTER — Other Ambulatory Visit: Payer: Self-pay | Admitting: Obstetrics and Gynecology

## 2018-06-10 ENCOUNTER — Other Ambulatory Visit (HOSPITAL_COMMUNITY)
Admission: RE | Admit: 2018-06-10 | Discharge: 2018-06-10 | Disposition: A | Discharge status: Home / Self Care | Payer: 59 | Source: Ambulatory Visit | Attending: Obstetrics and Gynecology | Admitting: Obstetrics and Gynecology

## 2018-06-10 DIAGNOSIS — Z124 Encounter for screening for malignant neoplasm of cervix: Secondary | ICD-10-CM | POA: Diagnosis not present

## 2018-06-15 LAB — CYTOLOGY - PAP
Diagnosis: NEGATIVE
HPV: NOT DETECTED

## 2018-10-27 ENCOUNTER — Other Ambulatory Visit: Payer: Self-pay | Admitting: Obstetrics and Gynecology

## 2018-10-27 ENCOUNTER — Other Ambulatory Visit: Payer: Self-pay | Admitting: Physician Assistant

## 2018-10-27 DIAGNOSIS — Z1231 Encounter for screening mammogram for malignant neoplasm of breast: Secondary | ICD-10-CM

## 2018-12-15 ENCOUNTER — Ambulatory Visit
Admission: RE | Admit: 2018-12-15 | Discharge: 2018-12-15 | Disposition: A | Discharge status: Home / Self Care | Payer: 59 | Source: Ambulatory Visit | Attending: Obstetrics and Gynecology | Admitting: Obstetrics and Gynecology

## 2018-12-15 ENCOUNTER — Other Ambulatory Visit: Payer: Self-pay

## 2018-12-15 DIAGNOSIS — Z1231 Encounter for screening mammogram for malignant neoplasm of breast: Secondary | ICD-10-CM

## 2019-11-11 ENCOUNTER — Other Ambulatory Visit: Payer: Self-pay | Admitting: Physician Assistant

## 2019-11-11 DIAGNOSIS — Z1231 Encounter for screening mammogram for malignant neoplasm of breast: Secondary | ICD-10-CM

## 2019-12-21 ENCOUNTER — Ambulatory Visit
Admission: RE | Admit: 2019-12-21 | Discharge: 2019-12-21 | Disposition: A | Discharge status: Home / Self Care | Payer: 59 | Source: Ambulatory Visit | Attending: Physician Assistant | Admitting: Physician Assistant

## 2019-12-21 ENCOUNTER — Other Ambulatory Visit: Payer: Self-pay

## 2019-12-21 ENCOUNTER — Ambulatory Visit: Payer: 59

## 2019-12-21 DIAGNOSIS — Z1231 Encounter for screening mammogram for malignant neoplasm of breast: Secondary | ICD-10-CM

## 2020-01-31 ENCOUNTER — Ambulatory Visit: Payer: 59

## 2020-06-09 IMAGING — MG DIGITAL SCREENING BILATERAL MAMMOGRAM WITH TOMO AND CAD
6 of 12 series · 6 of 36 positions shown · non-contrast
Comparison: Previous exam(s).

CLINICAL DATA: Screening.

EXAM:
DIGITAL SCREENING BILATERAL MAMMOGRAM WITH TOMO AND CAD

[L MLO synth-2D (1 of 2)]
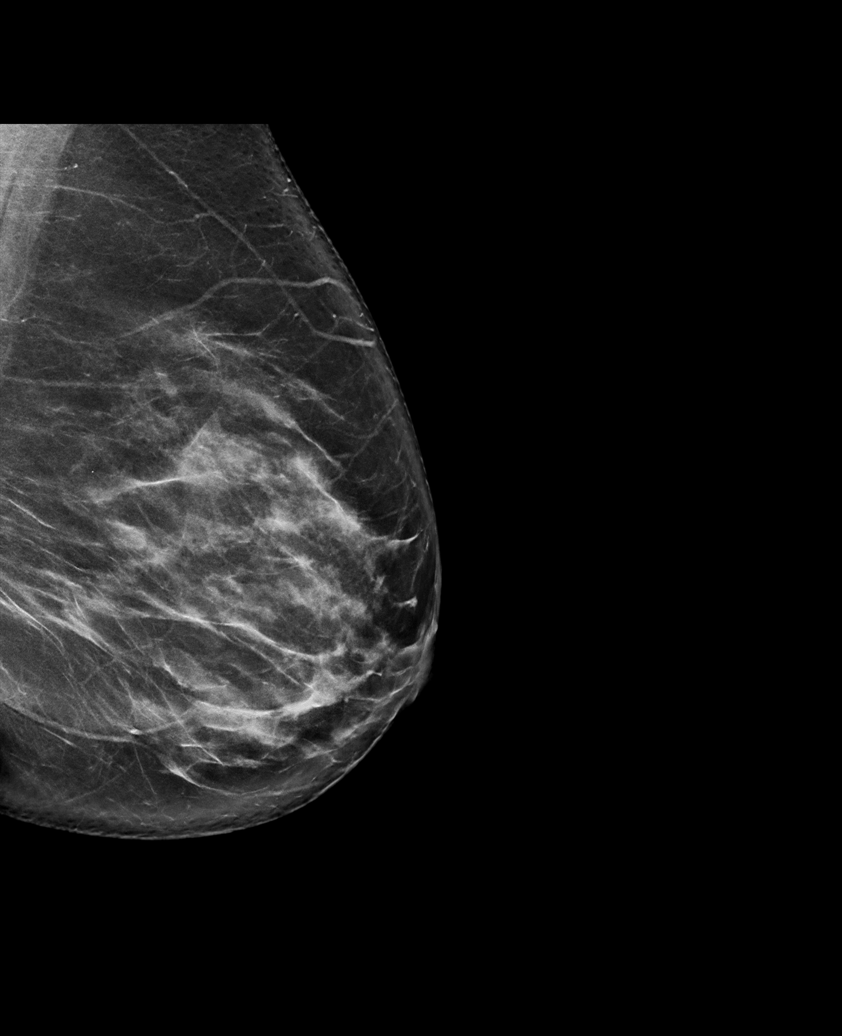

[R MLO synth-2D (1 of 2)]
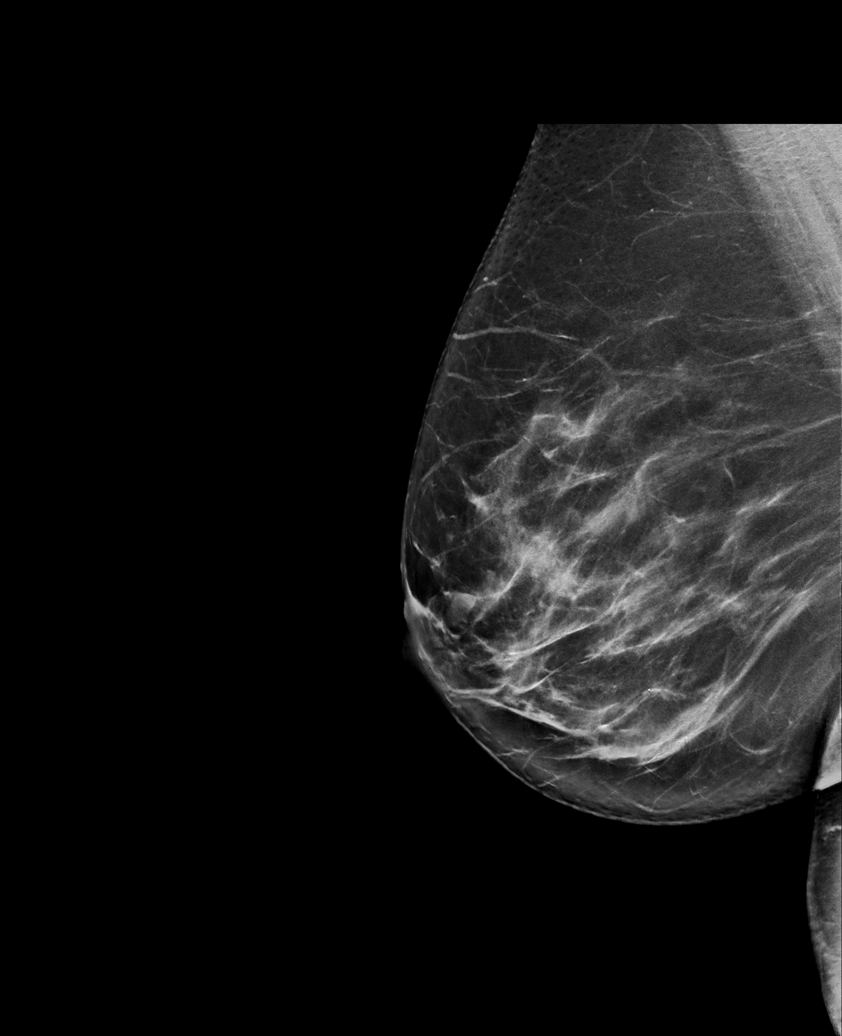

[L MLO synth-2D (2 of 2)]
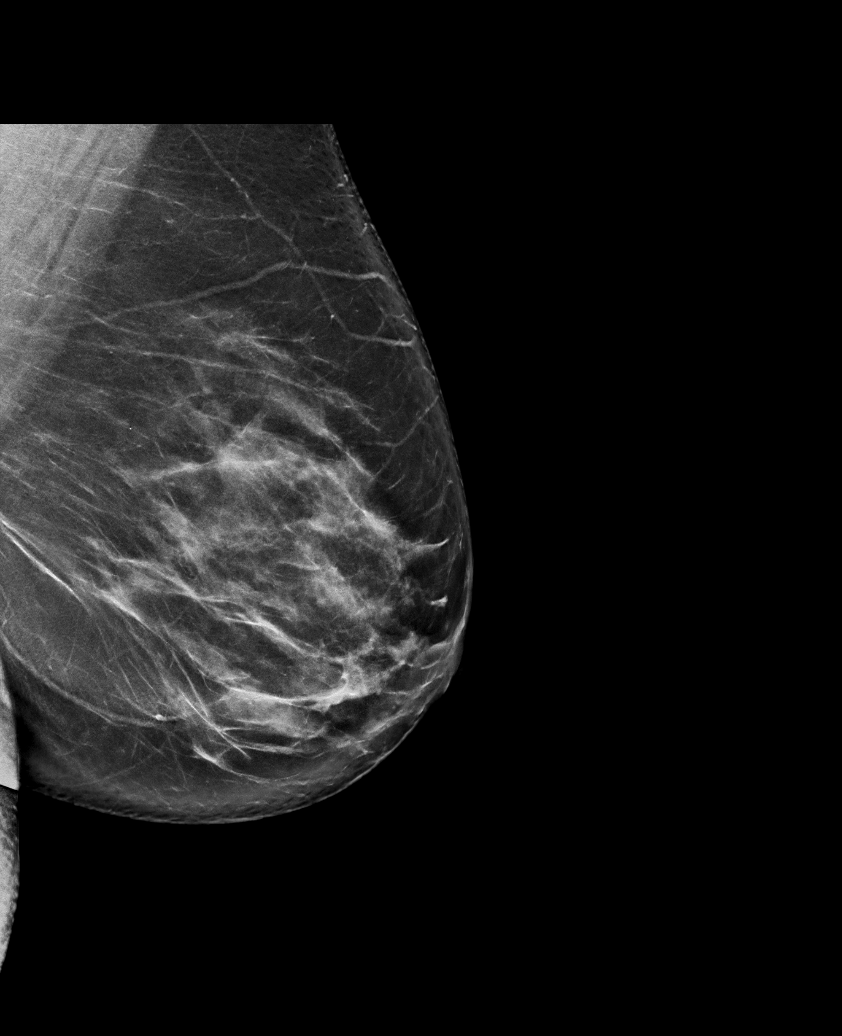

[L CC synth-2D]
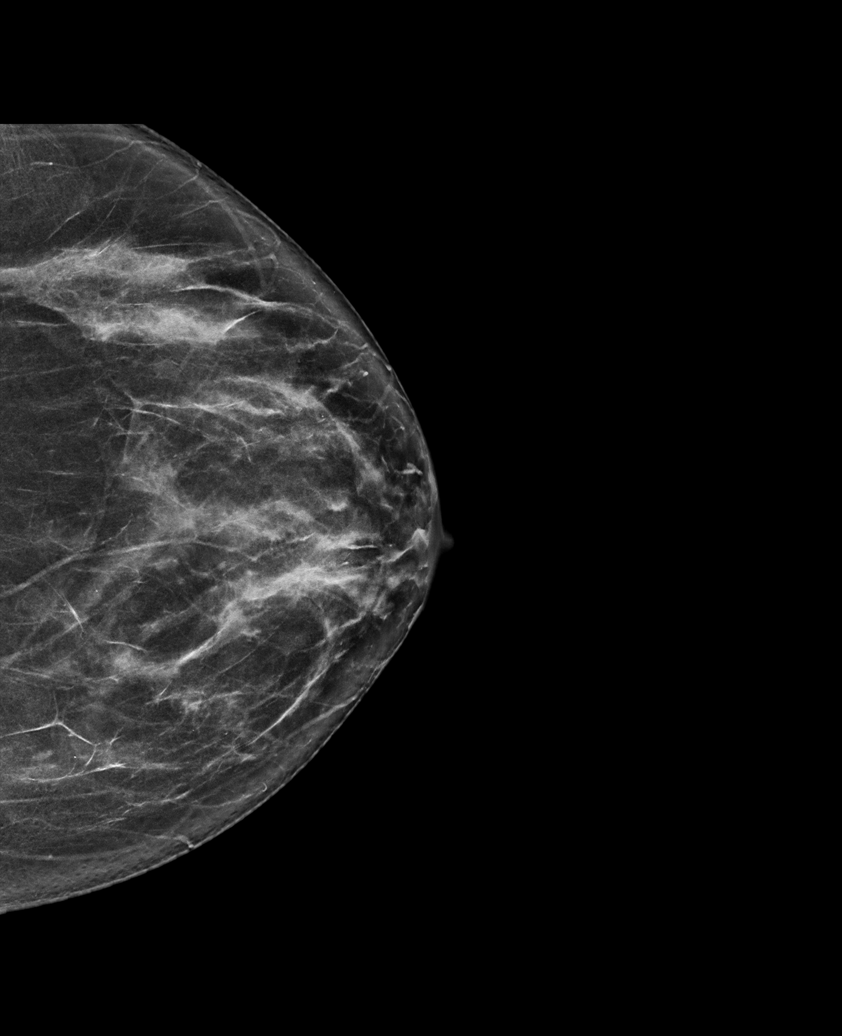

[R CC synth-2D]
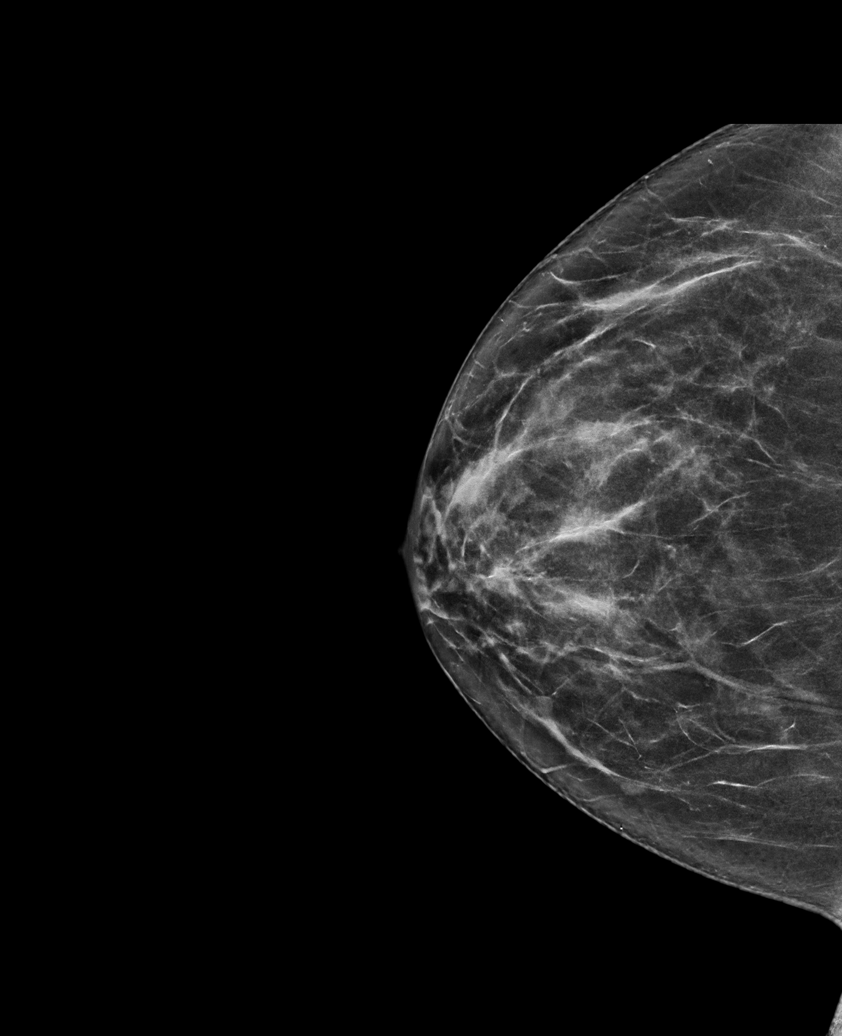

[R MLO synth-2D (2 of 2)]
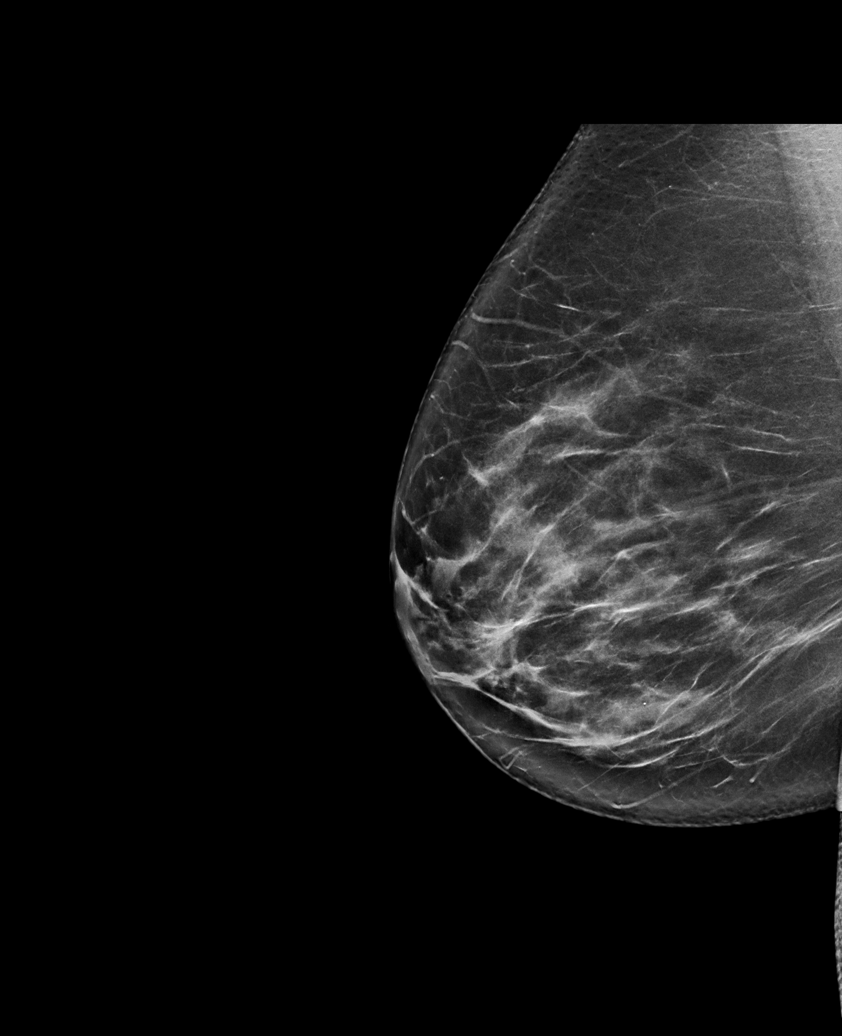

[6 of 36 positions shown; findings below may reference images not displayed]

ACR Breast Density Category c: The breast tissue is heterogeneously
dense, which may obscure small masses.
FINDINGS: There are no findings suspicious for malignancy. Images were
processed with CAD.
IMPRESSION: No mammographic evidence of malignancy. A result letter of this
screening mammogram will be mailed directly to the patient.

RECOMMENDATION:
Screening mammogram in one year. (Code:FT-U-LHB)

BI-RADS CATEGORY  1: Negative.

## 2020-11-21 ENCOUNTER — Other Ambulatory Visit: Payer: Self-pay | Admitting: Physician Assistant

## 2020-11-21 DIAGNOSIS — Z1231 Encounter for screening mammogram for malignant neoplasm of breast: Secondary | ICD-10-CM

## 2020-12-24 ENCOUNTER — Other Ambulatory Visit: Payer: Self-pay

## 2020-12-24 ENCOUNTER — Ambulatory Visit
Admission: RE | Admit: 2020-12-24 | Discharge: 2020-12-24 | Disposition: A | Discharge status: Home / Self Care | Payer: 59 | Source: Ambulatory Visit | Attending: Physician Assistant | Admitting: Physician Assistant

## 2020-12-24 DIAGNOSIS — Z1231 Encounter for screening mammogram for malignant neoplasm of breast: Secondary | ICD-10-CM

## 2021-11-18 ENCOUNTER — Other Ambulatory Visit: Payer: Self-pay | Admitting: Physician Assistant

## 2021-11-18 DIAGNOSIS — Z1231 Encounter for screening mammogram for malignant neoplasm of breast: Secondary | ICD-10-CM

## 2021-12-26 ENCOUNTER — Ambulatory Visit
Admission: RE | Admit: 2021-12-26 | Discharge: 2021-12-26 | Disposition: A | Discharge status: Home / Self Care | Payer: 59 | Source: Ambulatory Visit | Attending: Physician Assistant | Admitting: Physician Assistant

## 2021-12-26 DIAGNOSIS — Z1231 Encounter for screening mammogram for malignant neoplasm of breast: Secondary | ICD-10-CM

## 2022-09-12 ENCOUNTER — Emergency Department (HOSPITAL_COMMUNITY)
Admission: EM | Admit: 2022-09-12 | Discharge: 2022-09-12 | Disposition: A | Discharge status: Home / Self Care | Payer: 59 | Attending: Emergency Medicine | Admitting: Emergency Medicine

## 2022-09-12 ENCOUNTER — Other Ambulatory Visit: Payer: Self-pay

## 2022-09-12 ENCOUNTER — Encounter (HOSPITAL_COMMUNITY): Payer: Self-pay | Admitting: *Deleted

## 2022-09-12 ENCOUNTER — Emergency Department (HOSPITAL_COMMUNITY): Payer: 59

## 2022-09-12 DIAGNOSIS — M79641 Pain in right hand: Secondary | ICD-10-CM | POA: Diagnosis present

## 2022-09-12 DIAGNOSIS — S60221A Contusion of right hand, initial encounter: Secondary | ICD-10-CM | POA: Insufficient documentation

## 2022-09-12 DIAGNOSIS — Y9301 Activity, walking, marching and hiking: Secondary | ICD-10-CM | POA: Insufficient documentation

## 2022-09-12 DIAGNOSIS — X509XXA Other and unspecified overexertion or strenuous movements or postures, initial encounter: Secondary | ICD-10-CM | POA: Insufficient documentation

## 2022-09-12 DIAGNOSIS — S93401A Sprain of unspecified ligament of right ankle, initial encounter: Secondary | ICD-10-CM | POA: Insufficient documentation

## 2022-09-12 MED ORDER — IBUPROFEN 400 MG PO TABS
600.0000 mg | ORAL_TABLET | Freq: Once | ORAL | Status: DC
  Filled 2022-09-12: qty 2

## 2022-09-12 MED ORDER — IBUPROFEN 600 MG PO TABS: 600.0000 mg | ORAL_TABLET | Freq: Four times a day (QID) | ORAL | 0 refills | Status: AC | PRN

## 2022-09-12 NOTE — ED Provider Notes (Signed)
[place] EMERGENCY DEPARTMENT AT [facility] Provider Note   CSN: [id number] Arrival date & time: 09/12/22  4098     History  Chief Complaint  Patient presents with   Sheri Reed is a 57 y.o. female.  [facility] to the ER for pain to the right hand as well as right foot and ankle.  She states 2 days ago she tripped over a rock and fell to the ground and had some swelling to the right hand after that, yesterday she was walking to her mailbox and tripped and twisted the right ankle.  She did sustain some abrasions to the knees which are superficial, was dressed with Band-Aids at home, no complaints about this and no knee pain but concerned with the swelling and pain with weightbearing to the right ankle and the swelling to the right hand as well.  No numbness or tingling.  She is not on blood thinners, no head injury.  No loss of consciousness, she did not get dizzy.  She tried ice and Voltaren gel last night with mild relief.   Fall       Home Medications Prior to Admission medications   Medication Sig Start Date End Date Taking? Authorizing Provider  ibuprofen (ADVIL) 600 MG tablet Take 1 tablet (600 mg total) by mouth every 6 (six) hours as needed. 09/12/22  Yes Giuliano Preece A, PA-C  Cholecalciferol (VITAMIN D3) 400 units CAPS Take 40 Units by mouth daily.    [provider]  Flaxseed, Linseed, (FLAX SEEDS PO) Take 1 tablet by mouth daily.    [provider]  hydroxychloroquine (PLAQUENIL) 200 MG tablet Take 400 mg by mouth daily. 06/03/16   [provider]  NU-IRON 150 MG capsule Take 150 mg by mouth daily. 08/01/16   [provider]  omeprazole (PRILOSEC) 20 MG capsule Take 40 mg by mouth daily. 07/14/16   [provider]  progesterone (PROMETRIUM) 200 MG capsule Take 200 mg by mouth at bedtime. 08/11/16   [provider]  simethicone (GAS-X) 80 MG chewable tablet Chew 1 tablet (80 mg total) by mouth every 6  (six) hours as needed for flatulence. 08/14/16 08/14/17  Vassie Loll, MD  traMADol (ULTRAM) 50 MG tablet Take 1 tablet (50 mg total) by mouth every 6 (six) hours as needed for moderate pain or severe pain. 12/03/16   Geryl Rankins, MD  vitamin B-12 (CYANOCOBALAMIN) 100 MCG tablet Take 100 mcg by mouth daily.    [provider]      Allergies    Betadine [povidone iodine], Shellfish allergy, Aspirin, Codeine, Dust mite extract, Penicillins, and Sulfa antibiotics    Review of Systems   Review of Systems  Physical Exam Updated Vital Signs BP (!) 146/89 (BP Location: Right Arm)   Pulse 92   Temp 98.1 F (36.7 C) (Oral)   Resp 14   Ht 5' 0.5" (1.537 m)   Wt 93.4 kg   LMP 09/03/2016   SpO2 100%   BMI 39.57 kg/m  Physical Exam Vitals and nursing note reviewed.  Constitutional:      General: She is not in acute distress.    Appearance: She is well-developed.  HENT:     Head: Normocephalic and atraumatic.     Mouth/Throat:     Mouth: Mucous membranes are moist.  Eyes:     Conjunctiva/sclera: Conjunctivae normal.  Cardiovascular:     Rate and Rhythm: Normal rate and regular rhythm.  Heart sounds: No murmur heard. Pulmonary:     Effort: Pulmonary effort is normal. No respiratory distress.     Breath sounds: Normal breath sounds.  Abdominal:     Palpations: Abdomen is soft.     Tenderness: There is no abdominal tenderness.  Musculoskeletal:        General: Swelling present.     Cervical back: Neck supple.     Comments: Swelling to her ankle and dorsum of the right foot with mild lateral ankle tenderness.  DP and PT pulses intact, sensation intact, no redness or warmth.  Thompson test normal, no defect felt over the Achilles tendon.  Mild tenderness to the dorsum of the right foot as well with swelling.  Mild swelling with tenderness over the dorsal right hand, no wrist tenderness, normal range of motion of the hand and fingers, patient awake thumbs up and okay sign.   Radial pulse intact.  Skin:    General: Skin is warm and dry.     Capillary Refill: Capillary refill takes less than 2 seconds.  Neurological:     General: No focal deficit present.     Mental Status: She is alert and oriented to person, place, and time.  Psychiatric:        Mood and Affect: Mood normal.     ED Results / Procedures / Treatments   Labs (all labs ordered are listed, but only abnormal results are displayed) Labs Reviewed - No data to display  EKG None  Radiology DG Foot Complete Right  Result Date: 09/12/2022 CLINICAL DATA:  Right foot pain after fall. EXAM: RIGHT FOOT COMPLETE - 3+ VIEW COMPARISON:  None Available. FINDINGS: There is no evidence of fracture or dislocation. Hammertoe deformity of the toes. Benign-appearing sclerotic density in the calcaneus, no suspicious characteristics. No erosive change. Mild soft tissue edema. IMPRESSION: Soft tissue edema without acute fracture or dislocation. Electronically Signed   By: Narda Rutherford M.D.   On: 09/12/2022 10:44   DG Hand Complete Right  Result Date: 09/12/2022 CLINICAL DATA:  Right hand pain after fall.  Swelling. EXAM: RIGHT HAND - COMPLETE 3+ VIEW COMPARISON:  None Available. FINDINGS: There is no evidence of fracture or dislocation. Minor degenerative spurring. No erosive change. Dorsal soft tissue edema. IMPRESSION: Dorsal soft tissue edema. No fracture or subluxation. Electronically Signed   By: Narda Rutherford M.D.   On: 09/12/2022 10:43   DG Ankle Complete Left  Result Date: 09/12/2022 CLINICAL DATA:  Left ankle pain after fall. EXAM: LEFT ANKLE COMPLETE - 3+ VIEW COMPARISON:  None Available. FINDINGS: There is no evidence of fracture, dislocation, or joint effusion. The ankle mortise is preserved. Minimal Achilles tendon enthesophyte. There is no evidence of arthropathy or other focal bone abnormality. Soft tissue edema. IMPRESSION: Soft tissue edema without acute fracture or subluxation. Electronically  Signed   By: Narda Rutherford M.D.   On: 09/12/2022 10:42    Procedures Procedures    Medications Ordered in ED Medications  ibuprofen (ADVIL) tablet 600 mg (has no administration in time range)    ED Course/ Medical Decision Making/ A&P                                 Medical Decision Making DDx: Fracture, sprain, strain, dislocation, other  ED course: Patient had 2 mechanical falls over the past 2 days, no head injury or loss of consciousness she is not on blood thinners.  X-rays show no fractures or dislocation I agree with radiology read.  She does have soft tissue swelling, discussed likely sprain/strain, advised on NSAIDs, follow-up return precautions.  Amount and/or Complexity of Data Reviewed Radiology: ordered and independent interpretation performed.    Details: X-ray left hand, soft tissue swelling of the dorsum of the hand, no fracture or dislocation X-ray left foot, soft tissue swelling no fracture or dislocation X-ray left ankle, no fracture or dislocation           Final Clinical Impression(s) / ED Diagnoses Final diagnoses:  Sprain of right ankle, unspecified ligament, initial encounter  Contusion of right hand, initial encounter    Rx / DC Orders ED Discharge Orders          Ordered    ibuprofen (ADVIL) 600 MG tablet  Every 6 hours PRN        09/12/22 1301              Ma Rings, PA-C 09/12/22 1302    Loetta Rough, MD 09/12/22 1421

## 2022-09-12 NOTE — Discharge Instructions (Addendum)
It was a pleasure taking care of you today.  Fortunately your x-rays did not show any broken bones or dislocations.  Likely sprains are causing your swelling and pain.  You take the ibuprofen for pain, elevate your limb, limit your walking and follow-up with orthopedic doctor.  Come back to the ER for new or worsening symptoms.  If you are still having a lot of pain on Monday please contact your primary care doctor for further work note since the driving may be difficult with your ankle pain and swelling

## 2022-09-12 NOTE — ED Triage Notes (Signed)
Pt c/o 2 falls in the last few days with c/o pain to right hand, bilateral knees, right foot, and left ankle. Pt has obvious swelling to right hand and abrasions to bilateral knees.

## 2022-09-12 NOTE — ED Notes (Signed)
ED Provider at bedside. 

## 2022-09-12 NOTE — ED Notes (Signed)
Pt refused walker.

## 2022-09-12 NOTE — ED Notes (Signed)
Pt fell after tripped over a rock on Wednesday, fell again Thursday at the mailbox.  Denies hitting her head with either fall. Tried to catch self and with right hand swelling.  Not able to make a tight fist with right hand.

## 2022-12-08 ENCOUNTER — Other Ambulatory Visit: Payer: Self-pay | Admitting: Physician Assistant

## 2022-12-08 DIAGNOSIS — Z1231 Encounter for screening mammogram for malignant neoplasm of breast: Secondary | ICD-10-CM

## 2023-01-01 ENCOUNTER — Ambulatory Visit
Admission: RE | Admit: 2023-01-01 | Discharge: 2023-01-01 | Disposition: A | Discharge status: Home / Self Care | Payer: 59 | Source: Ambulatory Visit | Attending: Physician Assistant | Admitting: Physician Assistant

## 2023-01-01 ENCOUNTER — Ambulatory Visit: Payer: 59

## 2023-01-01 DIAGNOSIS — Z1231 Encounter for screening mammogram for malignant neoplasm of breast: Secondary | ICD-10-CM
